# Patient Record
Sex: Female | Born: 1935 | Race: White | Hispanic: No | Smoking: Never smoker
Health system: Southern US, Community
[De-identification: ages and names within clinical notes are randomized; demographics above are authoritative.]

## PROBLEM LIST (undated history)

## (undated) DIAGNOSIS — Z950 Presence of cardiac pacemaker: Secondary | ICD-10-CM

## (undated) DIAGNOSIS — I2510 Atherosclerotic heart disease of native coronary artery without angina pectoris: Secondary | ICD-10-CM

## (undated) DIAGNOSIS — F0280 Dementia in other diseases classified elsewhere without behavioral disturbance: Secondary | ICD-10-CM

## (undated) DIAGNOSIS — I509 Heart failure, unspecified: Secondary | ICD-10-CM

## (undated) DIAGNOSIS — K37 Unspecified appendicitis: Secondary | ICD-10-CM

## (undated) DIAGNOSIS — I255 Ischemic cardiomyopathy: Secondary | ICD-10-CM

## (undated) DIAGNOSIS — G309 Alzheimer's disease, unspecified: Secondary | ICD-10-CM

## (undated) HISTORY — PX: CORONARY ARTERY BYPASS GRAFT: SHX141

## (undated) HISTORY — PX: CARDIAC CATHETERIZATION: SHX172

## (undated) HISTORY — PX: APPENDECTOMY: SHX54

## (undated) HISTORY — PX: ABDOMINAL HYSTERECTOMY: SHX81

---

## 2003-05-11 ENCOUNTER — Other Ambulatory Visit: Payer: Self-pay

## 2003-05-12 ENCOUNTER — Other Ambulatory Visit: Payer: Self-pay

## 2003-05-28 ENCOUNTER — Other Ambulatory Visit: Payer: Self-pay

## 2003-05-29 ENCOUNTER — Other Ambulatory Visit: Payer: Self-pay

## 2003-10-10 ENCOUNTER — Other Ambulatory Visit: Payer: Self-pay

## 2004-04-19 ENCOUNTER — Other Ambulatory Visit: Payer: Self-pay

## 2004-04-19 ENCOUNTER — Emergency Department: Payer: Self-pay | Admitting: Emergency Medicine

## 2004-07-26 ENCOUNTER — Inpatient Hospital Stay: Payer: Self-pay | Admitting: Internal Medicine

## 2004-07-26 ENCOUNTER — Other Ambulatory Visit: Payer: Self-pay

## 2004-12-14 ENCOUNTER — Other Ambulatory Visit: Payer: Self-pay

## 2004-12-14 ENCOUNTER — Inpatient Hospital Stay: Payer: Self-pay | Admitting: Internal Medicine

## 2005-06-20 ENCOUNTER — Other Ambulatory Visit: Payer: Self-pay

## 2005-06-20 ENCOUNTER — Inpatient Hospital Stay: Payer: Self-pay | Admitting: Internal Medicine

## 2005-06-21 ENCOUNTER — Other Ambulatory Visit: Payer: Self-pay

## 2005-08-14 ENCOUNTER — Inpatient Hospital Stay: Payer: Self-pay | Admitting: Internal Medicine

## 2005-08-14 ENCOUNTER — Other Ambulatory Visit: Payer: Self-pay

## 2006-01-07 ENCOUNTER — Inpatient Hospital Stay: Payer: Self-pay | Admitting: Internal Medicine

## 2006-03-25 ENCOUNTER — Ambulatory Visit: Payer: Self-pay

## 2006-04-28 ENCOUNTER — Other Ambulatory Visit: Payer: Self-pay

## 2006-04-28 ENCOUNTER — Emergency Department: Payer: Self-pay | Admitting: Emergency Medicine

## 2006-09-06 ENCOUNTER — Other Ambulatory Visit: Payer: Self-pay

## 2006-09-06 ENCOUNTER — Inpatient Hospital Stay: Payer: Self-pay | Admitting: Internal Medicine

## 2007-05-06 ENCOUNTER — Other Ambulatory Visit: Payer: Self-pay

## 2007-05-06 ENCOUNTER — Inpatient Hospital Stay: Payer: Self-pay | Admitting: Internal Medicine

## 2007-05-27 ENCOUNTER — Inpatient Hospital Stay: Payer: Self-pay | Admitting: Internal Medicine

## 2007-05-27 ENCOUNTER — Other Ambulatory Visit: Payer: Self-pay

## 2007-05-28 ENCOUNTER — Other Ambulatory Visit: Payer: Self-pay

## 2007-06-02 ENCOUNTER — Other Ambulatory Visit: Payer: Self-pay

## 2007-06-02 ENCOUNTER — Inpatient Hospital Stay: Payer: Self-pay | Admitting: Internal Medicine

## 2007-10-09 ENCOUNTER — Other Ambulatory Visit: Payer: Self-pay

## 2007-10-09 ENCOUNTER — Emergency Department: Payer: Self-pay | Admitting: Emergency Medicine

## 2007-12-19 ENCOUNTER — Other Ambulatory Visit: Payer: Self-pay

## 2007-12-19 ENCOUNTER — Emergency Department: Payer: Self-pay | Admitting: Emergency Medicine

## 2007-12-21 ENCOUNTER — Other Ambulatory Visit: Payer: Self-pay

## 2007-12-21 ENCOUNTER — Inpatient Hospital Stay: Payer: Self-pay | Admitting: Rheumatology

## 2008-05-05 ENCOUNTER — Emergency Department: Payer: Self-pay | Admitting: Emergency Medicine

## 2008-09-08 ENCOUNTER — Ambulatory Visit: Payer: Self-pay

## 2009-04-26 IMAGING — CT CT THORACIC SPINE WITHOUT CONTRAST
2 of 3 series · 9 of 14 positions shown, 11 images · non-contrast
Comparison: none

REASON FOR EXAM: back pain, bilateral leg weakness
COMMENTS:

[Series 2: thoracic axial soft tissue range 1 · axial · 0.56mm/px · z∈[-297,-259]mm · 3 of 85 slices shown]
[im 22/85  soft-tissue]
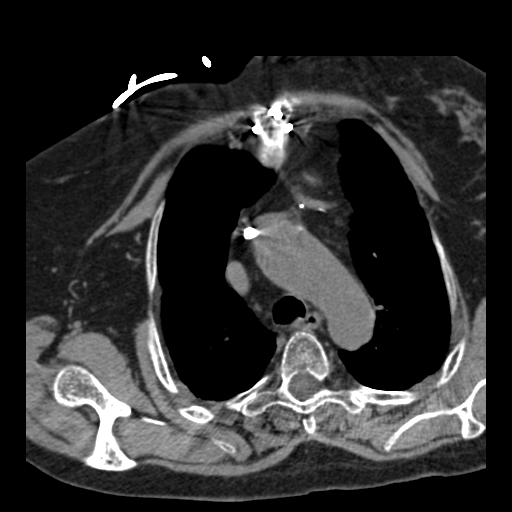
[im 43/85  soft-tissue]
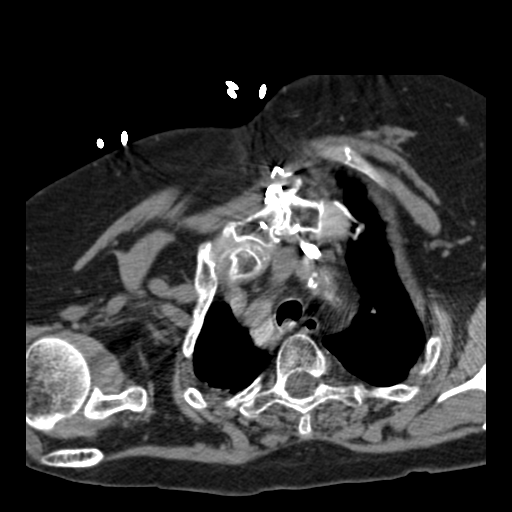
[im 64/85  soft-tissue]
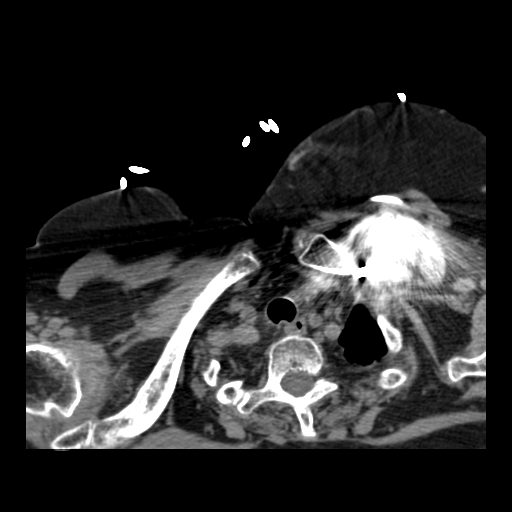

[Series 4: thoracic soft tissue range 3 · axial · 0.56mm/px · z∈[-364,-251]mm · 6 of 161 slices shown, 8 images]
[im 23/161  soft-tissue]
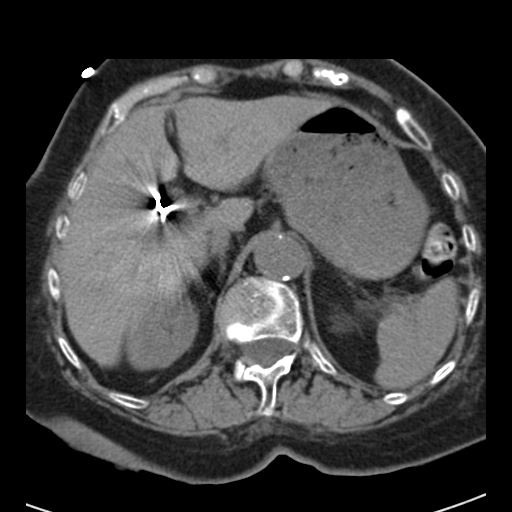
[im 23/161  bone]
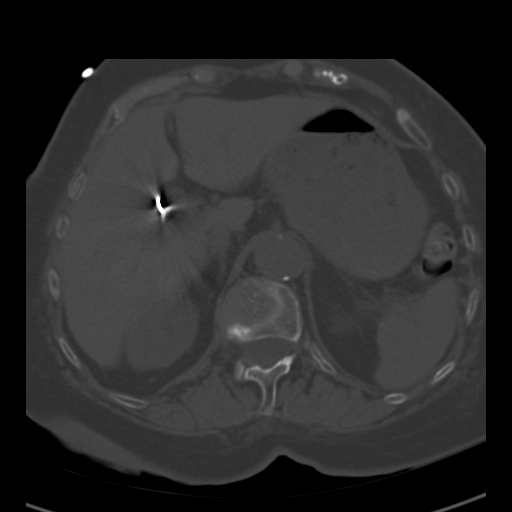
[im 46/161  bone]
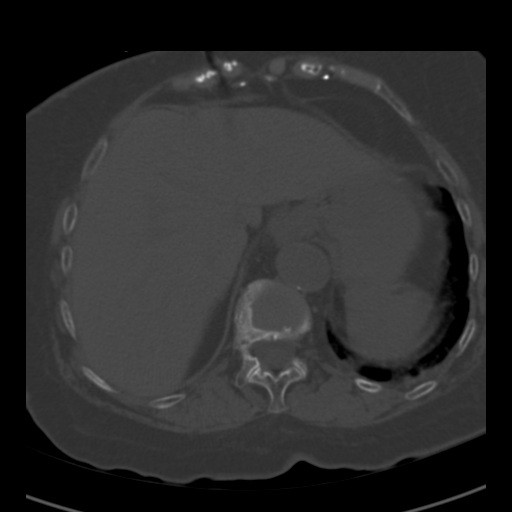
[im 69/161  bone]
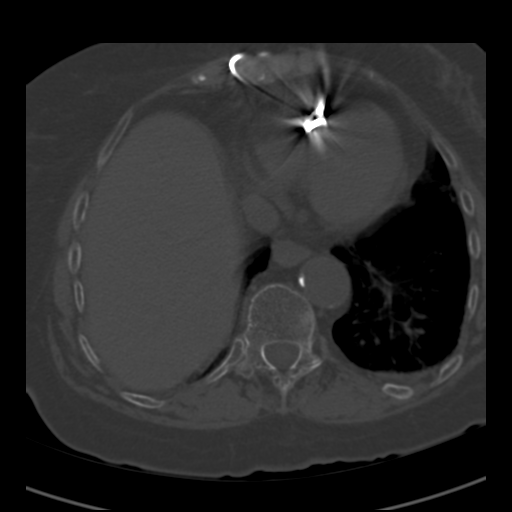
[im 92/161  bone]
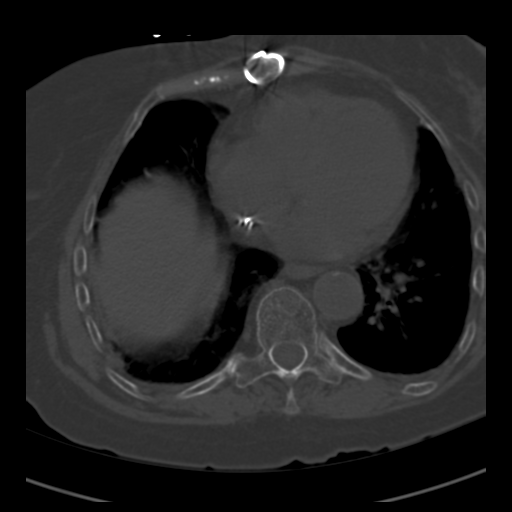
[im 115/161  soft-tissue]
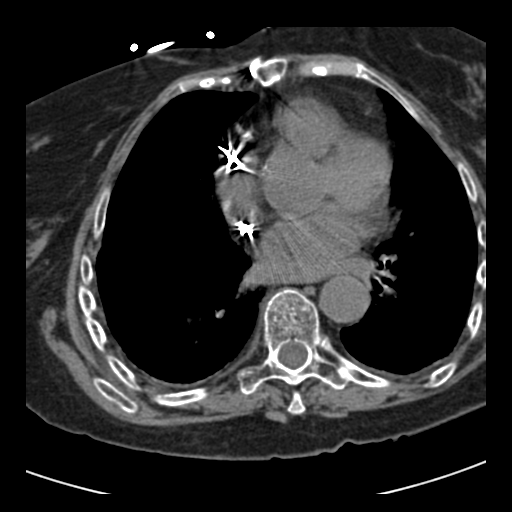
[im 115/161  bone]
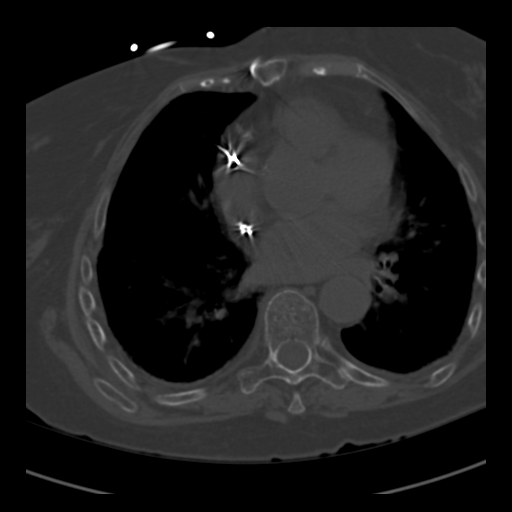
[im 138/161  bone]
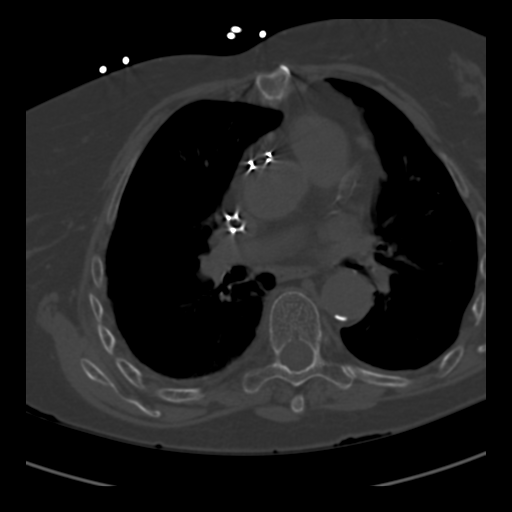

[9 of 14 positions shown; findings below may reference images not displayed]

PROCEDURE:     CT  - CT THORACIC SPINE WO  - December 23, 2007  [DATE]

RESULT:     Non-contrast multislice helical CT through the thoracic spine
with soft tissue axial, sagittal and coronal reconstructions is performed.
There is no previous similar exam for comparison.

The study demonstrates multilevel degenerative bony changes. There is loss
of height in the T6 and T7 vertebral bodies which appears to be
approximately 30% to 50%. There is no subluxation. Multilevel facet
hypertrophy is present. A discrete focal disc herniation is not evident. No
bony destruction is seen.
IMPRESSION: 1.Bony degenerative changes. No evidence of severe spinal stenosis or
definite severe foraminal stenosis. If further investigation is desired,
myelographic imaging could be considered.

## 2010-07-07 ENCOUNTER — Inpatient Hospital Stay: Payer: Self-pay | Admitting: Internal Medicine

## 2010-08-12 ENCOUNTER — Ambulatory Visit: Payer: Self-pay

## 2010-09-03 ENCOUNTER — Emergency Department: Payer: Self-pay | Admitting: Emergency Medicine

## 2012-04-17 ENCOUNTER — Inpatient Hospital Stay (HOSPITAL_COMMUNITY)
Admission: EM | Admit: 2012-04-17 | Discharge: 2012-04-18 | DRG: 287 | Disposition: A | Discharge status: Home / Self Care | Payer: Medicare Other | Attending: Cardiovascular Disease | Admitting: Cardiovascular Disease

## 2012-04-17 ENCOUNTER — Encounter (HOSPITAL_COMMUNITY): Payer: Self-pay

## 2012-04-17 ENCOUNTER — Ambulatory Visit (HOSPITAL_COMMUNITY): Admit: 2012-04-17 | Payer: Self-pay | Admitting: Cardiovascular Disease

## 2012-04-17 ENCOUNTER — Encounter (HOSPITAL_COMMUNITY)
Admission: EM | Disposition: A | Discharge status: Home / Self Care | Payer: Self-pay | Source: Home / Self Care | Attending: Cardiovascular Disease

## 2012-04-17 ENCOUNTER — Inpatient Hospital Stay (HOSPITAL_COMMUNITY): Payer: Medicare Other

## 2012-04-17 ENCOUNTER — Other Ambulatory Visit: Payer: Self-pay

## 2012-04-17 DIAGNOSIS — Z951 Presence of aortocoronary bypass graft: Secondary | ICD-10-CM

## 2012-04-17 DIAGNOSIS — R21 Rash and other nonspecific skin eruption: Secondary | ICD-10-CM | POA: Diagnosis present

## 2012-04-17 DIAGNOSIS — I249 Acute ischemic heart disease, unspecified: Secondary | ICD-10-CM

## 2012-04-17 DIAGNOSIS — I2510 Atherosclerotic heart disease of native coronary artery without angina pectoris: Secondary | ICD-10-CM

## 2012-04-17 DIAGNOSIS — I255 Ischemic cardiomyopathy: Secondary | ICD-10-CM | POA: Diagnosis present

## 2012-04-17 DIAGNOSIS — I447 Left bundle-branch block, unspecified: Secondary | ICD-10-CM | POA: Diagnosis present

## 2012-04-17 DIAGNOSIS — Z950 Presence of cardiac pacemaker: Secondary | ICD-10-CM

## 2012-04-17 DIAGNOSIS — R51 Headache: Secondary | ICD-10-CM | POA: Diagnosis present

## 2012-04-17 DIAGNOSIS — R079 Chest pain, unspecified: Secondary | ICD-10-CM | POA: Diagnosis present

## 2012-04-17 DIAGNOSIS — G250 Essential tremor: Secondary | ICD-10-CM

## 2012-04-17 DIAGNOSIS — G252 Other specified forms of tremor: Secondary | ICD-10-CM | POA: Diagnosis present

## 2012-04-17 DIAGNOSIS — I200 Unstable angina: Secondary | ICD-10-CM

## 2012-04-17 DIAGNOSIS — E785 Hyperlipidemia, unspecified: Secondary | ICD-10-CM

## 2012-04-17 DIAGNOSIS — E876 Hypokalemia: Secondary | ICD-10-CM | POA: Diagnosis present

## 2012-04-17 DIAGNOSIS — I359 Nonrheumatic aortic valve disorder, unspecified: Secondary | ICD-10-CM

## 2012-04-17 DIAGNOSIS — I10 Essential (primary) hypertension: Secondary | ICD-10-CM

## 2012-04-17 DIAGNOSIS — I25810 Atherosclerosis of coronary artery bypass graft(s) without angina pectoris: Secondary | ICD-10-CM

## 2012-04-17 DIAGNOSIS — I252 Old myocardial infarction: Secondary | ICD-10-CM

## 2012-04-17 HISTORY — DX: Unspecified appendicitis: K37

## 2012-04-17 HISTORY — DX: Ischemic cardiomyopathy: I25.5

## 2012-04-17 HISTORY — PX: PERCUTANEOUS CORONARY STENT INTERVENTION (PCI-S): SHX5485

## 2012-04-17 HISTORY — PX: LEFT HEART CATHETERIZATION WITH CORONARY ANGIOGRAM: SHX5451

## 2012-04-17 HISTORY — DX: Atherosclerotic heart disease of native coronary artery without angina pectoris: I25.10

## 2012-04-17 HISTORY — DX: Presence of cardiac pacemaker: Z95.0

## 2012-04-17 LAB — BASIC METABOLIC PANEL
Calcium: 8.8 mg/dL (ref 8.4–10.5)
GFR calc non Af Amer: 80 mL/min — ABNORMAL LOW (ref >90)
Glucose, Bld: 108 mg/dL — ABNORMAL HIGH (ref 70–99)
Sodium: 139 mEq/L (ref 135–145)

## 2012-04-17 LAB — POCT I-STAT, CHEM 8
Calcium, Ion: 1.08 mmol/L — ABNORMAL LOW (ref 1.13–1.30)
Glucose, Bld: 108 mg/dL — ABNORMAL HIGH (ref 70–99)
HCT: 39 % (ref 36.0–46.0)
Hemoglobin: 13.3 g/dL (ref 12.0–15.0)
Potassium: 3.2 mEq/L — ABNORMAL LOW (ref 3.5–5.1)

## 2012-04-17 LAB — CBC
MCH: 28.6 pg (ref 26.0–34.0)
Platelets: 158 10*3/uL (ref 150–400)
RBC: 4.48 MIL/uL (ref 3.87–5.11)
WBC: 6.4 10*3/uL (ref 4.0–10.5)

## 2012-04-17 LAB — DIFFERENTIAL
Basophils Absolute: 0.1 10*3/uL (ref 0.0–0.1)
Basophils Relative: 1 % (ref 0–1)
Eosinophils Absolute: 0.1 10*3/uL (ref 0.0–0.7)
Eosinophils Relative: 2 % (ref 0–5)
Lymphocytes Relative: 25 % (ref 12–46)

## 2012-04-17 LAB — PROTIME-INR: Prothrombin Time: 13.2 seconds (ref 11.6–15.2)

## 2012-04-17 SURGERY — LEFT HEART CATHETERIZATION WITH CORONARY ANGIOGRAM
Anesthesia: LOCAL

## 2012-04-17 MED ORDER — SODIUM CHLORIDE 0.9 % IJ SOLN
3.0000 mL | Freq: Two times a day (BID) | INTRAMUSCULAR | Status: DC
  Administered 2012-04-17 (×2): 3 mL via INTRAVENOUS

## 2012-04-17 MED ORDER — NITROGLYCERIN IN D5W 200-5 MCG/ML-% IV SOLN
INTRAVENOUS | Status: AC
  Administered 2012-04-17: 50000 ug
  Filled 2012-04-17: qty 250

## 2012-04-17 MED ORDER — FENTANYL CITRATE 0.05 MG/ML IJ SOLN
INTRAMUSCULAR | Status: AC
  Filled 2012-04-17: qty 2

## 2012-04-17 MED ORDER — ONDANSETRON HCL 4 MG/2ML IJ SOLN: 4.0000 mg | Freq: Four times a day (QID) | INTRAMUSCULAR | Status: DC | PRN

## 2012-04-17 MED ORDER — NITROGLYCERIN 0.4 MG SL SUBL
0.4000 mg | SUBLINGUAL_TABLET | SUBLINGUAL | Status: DC | PRN
  Filled 2012-04-17 (×2): qty 25

## 2012-04-17 MED ORDER — MORPHINE SULFATE 2 MG/ML IJ SOLN
INTRAMUSCULAR | Status: AC
  Filled 2012-04-17: qty 1

## 2012-04-17 MED ORDER — PANTOPRAZOLE SODIUM 40 MG PO TBEC
40.0000 mg | DELAYED_RELEASE_TABLET | Freq: Every day | ORAL | Status: DC
  Administered 2012-04-17 – 2012-04-18 (×2): 40 mg via ORAL
  Filled 2012-04-17 (×2): qty 1

## 2012-04-17 MED ORDER — HEPARIN (PORCINE) IN NACL 2-0.9 UNIT/ML-% IJ SOLN
INTRAMUSCULAR | Status: AC
  Filled 2012-04-17: qty 1000

## 2012-04-17 MED ORDER — ACETAMINOPHEN 325 MG PO TABS
650.0000 mg | ORAL_TABLET | ORAL | Status: DC | PRN
  Administered 2012-04-17 (×3): 650 mg via ORAL
  Filled 2012-04-17 (×4): qty 2

## 2012-04-17 MED ORDER — NITROGLYCERIN 0.2 MG/ML ON CALL CATH LAB
INTRAVENOUS | Status: AC
  Filled 2012-04-17: qty 1

## 2012-04-17 MED ORDER — NITROGLYCERIN IN D5W 200-5 MCG/ML-% IV SOLN
2.0000 ug/min | INTRAVENOUS | Status: DC
  Administered 2012-04-17: 5 ug/min via INTRAVENOUS

## 2012-04-17 MED ORDER — MIDAZOLAM HCL 2 MG/2ML IJ SOLN
INTRAMUSCULAR | Status: AC
  Filled 2012-04-17: qty 2

## 2012-04-17 MED ORDER — HEPARIN SODIUM (PORCINE) 5000 UNIT/ML IJ SOLN
60.0000 [IU]/kg | Freq: Once | INTRAMUSCULAR | Status: AC
  Administered 2012-04-17: 4000 [IU] via INTRAVENOUS

## 2012-04-17 MED ORDER — LIDOCAINE HCL (PF) 1 % IJ SOLN
INTRAMUSCULAR | Status: AC
  Filled 2012-04-17: qty 30

## 2012-04-17 MED ORDER — ENOXAPARIN SODIUM 40 MG/0.4ML ~~LOC~~ SOLN
40.0000 mg | SUBCUTANEOUS | Status: DC
  Filled 2012-04-17: qty 0.4

## 2012-04-17 MED ORDER — ASPIRIN EC 81 MG PO TBEC
81.0000 mg | DELAYED_RELEASE_TABLET | Freq: Every day | ORAL | Status: DC
  Administered 2012-04-18: 81 mg via ORAL
  Filled 2012-04-17: qty 1

## 2012-04-17 MED ORDER — SODIUM CHLORIDE 0.9 % IV SOLN
1.0000 mL/kg/h | INTRAVENOUS | Status: AC
  Administered 2012-04-17: 1 mL/kg/h via INTRAVENOUS

## 2012-04-17 MED ORDER — ACETAMINOPHEN 325 MG PO TABS
650.0000 mg | ORAL_TABLET | ORAL | Status: DC | PRN
  Filled 2012-04-17: qty 2

## 2012-04-17 MED ORDER — SODIUM CHLORIDE 0.9 % IJ SOLN: 3.0000 mL | INTRAMUSCULAR | Status: DC | PRN

## 2012-04-17 MED ORDER — INFLUENZA VIRUS VACC SPLIT PF IM SUSP
0.5000 mL | INTRAMUSCULAR | Status: AC
  Administered 2012-04-18: 0.5 mL via INTRAMUSCULAR
  Filled 2012-04-17: qty 0.5

## 2012-04-17 MED ORDER — MIDAZOLAM HCL 2 MG/2ML IJ SOLN
INTRAMUSCULAR | Status: AC
  Administered 2012-04-17: 02:00:00
  Filled 2012-04-17: qty 2

## 2012-04-17 MED ORDER — SODIUM CHLORIDE 0.9 % IV SOLN: 250.0000 mL | INTRAVENOUS | Status: DC

## 2012-04-17 MED ORDER — METOPROLOL TARTRATE 25 MG PO TABS
25.0000 mg | ORAL_TABLET | Freq: Two times a day (BID) | ORAL | Status: DC
  Administered 2012-04-17 – 2012-04-18 (×2): 25 mg via ORAL
  Filled 2012-04-17 (×3): qty 1

## 2012-04-17 MED ORDER — PRIMIDONE 50 MG PO TABS
50.0000 mg | ORAL_TABLET | Freq: Two times a day (BID) | ORAL | Status: DC
  Administered 2012-04-17 – 2012-04-18 (×2): 50 mg via ORAL
  Filled 2012-04-17 (×3): qty 1

## 2012-04-17 MED ORDER — POTASSIUM CHLORIDE CRYS ER 20 MEQ PO TBCR
40.0000 meq | EXTENDED_RELEASE_TABLET | Freq: Once | ORAL | Status: AC
  Administered 2012-04-17: 40 meq via ORAL
  Filled 2012-04-17: qty 2

## 2012-04-17 MED ORDER — ONDANSETRON HCL 4 MG/2ML IJ SOLN
INTRAMUSCULAR | Status: AC
  Administered 2012-04-17: 4 mg
  Filled 2012-04-17: qty 2

## 2012-04-17 MED ORDER — CLOPIDOGREL BISULFATE 75 MG PO TABS
75.0000 mg | ORAL_TABLET | Freq: Every day | ORAL | Status: DC
  Administered 2012-04-17 – 2012-04-18 (×2): 75 mg via ORAL
  Filled 2012-04-17 (×2): qty 1

## 2012-04-17 MED ORDER — LABETALOL HCL 5 MG/ML IV SOLN
INTRAVENOUS | Status: AC
  Filled 2012-04-17: qty 4

## 2012-04-17 NOTE — ED Notes (Signed)
Per EMS, given 324 ASA, 1 inch paste, 3 SL nitro.

## 2012-04-17 NOTE — Progress Notes (Signed)
Called report to [place] on 3W. Notified patients family of move. Will continue to monitor.

## 2012-04-17 NOTE — Progress Notes (Signed)
Technician here to perform Echo at bedside. Transfer placed on hold for this procedure to be done.

## 2012-04-17 NOTE — H&P (Signed)
Patient ID: Joanna Park MRN: [id number], DOB/AGE: 76-Sep-1930   Admit date: 04/17/2012   Primary Physician: Bethann Punches Primary Cardiologist: Dr. Gwen Pounds [facility])  Pt. Profile:  Problem List  Past Medical History  Diagnosis Date  . MI (myocardial infarction)   . Hx of CABG   . Pacemaker   . Appendicitis     Past Surgical History  Procedure Date  . Coronary artery bypass graft      Allergies  Allergies  Allergen Reactions  . Morphine And Related     HPI  78 y/o female with h/o CABG (anatomy unknown) who states that yesterday evening ~11:30 pm she developed the acute onset of substernal chest pressure that felt like a "boulder sitting on her chest."  It radiated to the neck and was associated with dyspnea.  She states it felt like her prior MI.  EMS was called and did an ECG which showed a LBBB.  A STEMI alert was called and she was transferred to the [facility] cath lab as we had no old ECGs to confirm if the LBBB was new or old.  She was given ASA 325, nitropaste, and heparin 4000 unit bolus en route.  Cath showed patent SVG to Diag, patent SVG to distal RCA, patent LIMA to LAD; native 100% RCA occlusion; proximal moderate LCx disease with large patent OM; and a ramus vessel that was proximally occluded and filled via collaterals (possible culprit vessel); no PCI was performed as most major territories were adequately vascularized; LV gram showed apical hypokinesis  Speaking the with daughter and son-in-law afterwards, the patient is actually quite robust for her age.  She had been in her normal state of health until this acute episode.  Post-cath the patient continues to have chest discomfort but appears much more comfortable.  Home Medications  Prior to Admission medications   Not on File  Daughter is to bring in list tomorrow; it sounds like she takes aspirin, a PPI, and a blood pressure pill  Family History  Reviewed, noncontributory  Social  History  History   Social History  . Marital Status: Married    Spouse Name: N/A    Number of Children: N/A  . Years of Education: N/A   Occupational History  . Not on file.   Social History Main Topics  . Smoking status: Never Smoker   . Smokeless tobacco: Not on file  . Alcohol Use:   . Drug Use:   . Sexually Active:    Other Topics Concern  . Not on file   Social History Narrative  . No narrative on file     Review of Systems She complains of a mild HA that started after the nitropaste was applied. All other systems reviewed and are otherwise negative except as noted above.  Physical Exam  Blood pressure 162/85, pulse 60, temperature 98.1 F (36.7 C), temperature source Oral, resp. rate 19, height 5\' 4"  (1.626 m), weight 135 lb (61.236 kg), SpO2 100.00%.  General: moderate distress initially, now calm Psych: Normal affect. Neuro: Alert and oriented X 3. Moves all extremities spontaneously. HEENT: Normal  Neck: Supple without JVD Lungs:  Resp regular and unlabored, CTA. Heart: RRR no s3, s4, or murmurs. Abdomen: Soft, non-tender, non-distended Extremities: Warm, no edema. Femoral/radials 2+ and equal bilaterally.  Labs   Basename 04/17/12 0100  CKTOTAL --  CKMB --  TROPONINI <0.30   Lab Results  Component Value Date   WBC 6.4 04/17/2012   HGB 13.3 04/17/2012  HCT 39.0 04/17/2012   MCV 88.2 04/17/2012   PLT 158 04/17/2012    Lab 04/17/12 0108  NA 139  K 3.2*  CL 101  CO2 --  BUN 7  CREATININE 0.80  CALCIUM --  PROT --  BILITOT --  ALKPHOS --  ALT --  AST --  GLUCOSE 108*     Radiology/Studies  No results found.  ECG: A-paced in the 60s, LBBB with appropriate ST segment discordance (no old ECGs available)  ASSESSMENT AND PLAN 1. Chest pain- possibly MI from small ramus occlusion as noted above; also possibly non-cardiac (MSK, pleurisy, GERD); doubt PE given no tachycardia/hypoxia or real risk factors for PE, and dyspnea is not a main  complaint; aortic dissection was considered and the patient underwent an aortogram in the cath lab that did not reveal any dissection  -s/p cardiac cath with Mynx closure device; monitor for bleeding  -Nitro gtt for now, cycle troponins (if they bump, will start UFH gtt and treat medically for NSTEMI)  -check CXR, TTE 2. CAD s/p CABG  -daughter said she will bring in home med list in the a.m. 3. Hypokalemia  -will give 40 mEq KCl now 4. Prophylaxis  -SCDs for now given recent arterial puncture 5. Headache- from NTG; APAP prn  Full Code.    Ozella Almond, MD 04/17/2012, 1:45 AM  5

## 2012-04-17 NOTE — ED Notes (Signed)
Pt. Is poor historian.

## 2012-04-17 NOTE — CV Procedure (Addendum)
   Cardiac Catheterization Procedure Note  Name: Joanna Park MRN: [id number] DOB: August 04, 1928  Primary Cardiologist: Dr Gwen Pounds Primary Care Physician: Dr Hyacinth Meeker  Procedure: Left Heart Cath, Selective Coronary Angiography, LV angiography, saphenous vein graft angiography, LIMA angiography, aortic root angiography, closure of the right femoral artery.  Indication: 78 year old woman who presented with chest pain. She has known coronary disease with prior CABG. She is a poor historian we don't have any of the details of her bypass surgery. She states that her chest pain is just like that of her prior heart attack. She is uncomfortable in describing 6/10 chest pain. Her EKG shows an atrial paced rhythm with left bundle branch block. There is no old EKG for comparison. Considering her left bundle branch block, ongoing chest pain, and known coronary artery disease, we elected to proceed emergently with cardiac catheterization and possible PCI.  Procedural details: The right groin was prepped, draped, and anesthetized with 1% lidocaine. Using modified Seldinger technique, a 5 French sheath was introduced into the right femoral artery. Standard Judkins catheters were used for coronary angiography and left ventriculography. Catheter exchanges were performed over a guidewire.  JR 4 catheter was used for vein graft angiography. A 3 DRC catheter was used to image left subclavian artery and the LIMA nonselectively. A pigtail catheter was used to perform ventriculography and aortic root angiography. There were no immediate procedural complications. The patient was transferred to the post catheterization recovery area for further monitoring.  Procedural Findings: Hemodynamics:  AO  179/81 LV  176/9   Coronary angiography: Coronary dominance: right  Left mainstem: The left mainstem is patent. There is diffuse distal left mainstem stenosis of 70% with heavy calcification.   Left anterior descending (LAD):   the LAD is totally occluded at the ostium.  Left circumflex (LCx):  the left circumflex has a single large obtuse marginal branch with no significant stenosis. The origin of the left circumflex at the interface of the left main has diffuse calcified 50-70% stenosis. There is a small first OM branch with subtotal occlusion.  Right coronary artery (RCA): totally occlusion in the proximal segment.  Saphenous vein graft to diagonal branch: Widely patent throughout. There is collateral filling of an intermediate branch.  Saphenous vein graft to distal RCA: Widely patent throughout. The PDA and PLA branches are patent. The distal RCA fills retrograde and is severely diseased.  LIMA to LAD: This was imaged nonselectively. The subclavian was tortuous. There is no significant subclavian stenosis. The left vertebral artery is very large. The LIMA is patent throughout its course and its anastomotic site to the LAD is patent. The LAD appears small in caliber throughout its course.   Left ventriculography:  there is diffuse LV hypokinesis. The basal inferior wall is severe hypokinesis. The periapical region has moderate hypokinesis. The estimated left ventricular ejection fraction is 40-45%. I do not visualize any significant mitral regurgitation.   Aortic root angiography: There is no significant aortic insufficiency. I do not appreciate any evidence of dissection or aneurysm.  Final Conclusions:   1. Severe native 3 vessel CAD 2. S/p CABG with patent LIMA-LAD, SVG-RCA, and SVG-diagonal 3. Moderate LV systolic dysfunction  Recommendations: will cycle cardiac enzymes and check an echocardiogram. It is possible that the small obtuse marginal branches this patient's culprit vessel. However, this could also be a chronic lesion. I think that medical therapy is appropriate.  Tonny Bollman 04/17/2012, 2:25 AM

## 2012-04-17 NOTE — ED Notes (Signed)
Pt. Reports sudden onset chest pressure at 2340. Given 324 ASA, 1 inch paste, 3 SL nitro by EMS. States "Feels like my last MI".

## 2012-04-17 NOTE — ED Notes (Signed)
Pt. 135lbs and 5\' 4"  reported

## 2012-04-17 NOTE — Progress Notes (Signed)
  Echocardiogram 2D Echocardiogram has been performed.  Joanna Park FRANCES 04/17/2012, 4:48 PM

## 2012-04-17 NOTE — Progress Notes (Signed)
Brief Progress Note:  S: The patient is a very pleasant 78 YO woman. She did well with cath and has tolerated it well. No return of chest pain and no pain at this time. Still on nitro drip and fluids post cath. She is asking about when she can go home. No SOB, weakness, leg pain, nausea. Does complain of headache likely from NTG drip.  O: PE Gen: pleasant CV: lungs clear, heart sounds paced.  Abd: non tender non-distended, soft +BS Extrem: no edema or tenderness Skin: no breakdown  Update to A/P:  1. Chest pain r/o ACS - Troponin negative times 2 and will continue to follow,. S/P cath and intervention needed, some diffuse disease and will need medical management. Echo ordered for today and lipid panel. Can likely stop nitro drip today. Tylenol for nitro headache.  2. CAD - Still need home med list, on ASA daily. Continue to follow EKG.  Dispo -   Will transfer to tele bed.  Cath was unchanged. Will DC NTG drip.  Home tomorrow if she remains stable.  Dx 1. CAD , s/p CABG. 2. Chest pain - does not appear to be related to CAD' 3. LBBB 4, pacer.  Vesta Mixer, Montez Hageman., MD, [facility] 04/17/2012, 12:38 PM Office - [phone] Pager [phone]

## 2012-04-17 NOTE — Progress Notes (Signed)
CARDIAC REHAB PHASE I   PRE:  Rate/Rhythm: 67 Pacing  BP:  Supine: 137/63  Sitting: 135/73  Standing:    SaO2: 100 2L 100 RA  MODE:  Ambulation: 20 ft   POST:  Rate/Rhythem: 98  BP:  Supine:   Sitting: 147/74  Standing:    SaO2: 99 RA 1335-1420 RN states that pt got up to [facility] earlier and was unsteady and c/o of dizziness. On arrival pt in bed explained that she needs to try to get up and try to walk in hall. Pt's daughter in room states that that she uses walker at home and all she walks is in the house. Daughter does grocery shopping. Pt states that she fell on Thursday prior to coming to hospital. She c/o that her legs have felt very weak and tremble a lot when trying to walk. Assisted X 1 and used walker to ambulate. Pt weak and unsteady. Her legs tremble with walking. Pt only able to get to bathroom and was exhausted. Placed pt in recliner. VS stable. RA sat after walk 99%. O2 left off.Would recommend a Physical Therapy consult to assess discharge needs. No c/o of cp with walking to bathroom.  Beatrix Fetters

## 2012-04-17 NOTE — Progress Notes (Signed)
Chaplain Note: Chaplain responded immediately to code STEMI page received at 01:23.  Pt was in Cath Lab being treated by Cath Lab staff.  When pt's family arrived, chaplain provided spiritual comfort and support.  Following the pt's cath procedure, chaplain supported family while pt's physician explained the pt's condition.  Chaplain escorted family to CICU waiting area and informed CICU staff of their presence.  Family expressed appreciation for chaplain support.  Chaplain will follow up as needed.  04/17/12 0200  Clinical Encounter Type  Visited With Patient and family together  Visit Type Spiritual support  Referral From Other (Comment) (Code STEMI page)  Spiritual Encounters  Spiritual Needs Emotional  Stress Factors  Patient Stress Factors Major life changes;Health changes  Family Stress Factors Lack of knowledge   Verdie Shire, [place] [phone]

## 2012-04-17 NOTE — ED Provider Notes (Signed)
History     CSN: [id number]  Arrival date & time 04/17/12  0056   First MD Initiated Contact with Patient 04/17/12 0106      Chief Complaint  Patient presents with  . Code STEMI    (Consider location/radiation/quality/duration/timing/severity/associated sxs/prior treatment) HPI Comments: 78 year old female with a history of coronary disease status post CABG, status post pacemaker and myocardial infarction in the past who presents with a complaint of chest pain which started acute in onset in 1 hour ago, midsternal, heaviness, persistent, slightly improved with nitroglycerin paste. She was given aspirin by paramedics and route. Her EKG showed a left bundle branch block, a paced rhythm, there is no old EKG with which to compare and thus a code STEMI was called prior to arrival. The patient denies anynausea vomiting swelling of the legs or diaphoresis, but she does admit to mild shortness of breath.  The history is provided by the patient and the EMS personnel.    Past Medical History  Diagnosis Date  . Hx of CABG   . Pacemaker   . Appendicitis   . MI (myocardial infarction) 2011    Past Surgical History  Procedure Date  . Coronary artery bypass graft   . Appendectomy   . Abdominal hysterectomy   . Cardiac catheterization     No family history on file.  History  Substance Use Topics  . Smoking status: Never Smoker   . Smokeless tobacco: Not on file  . Alcohol Use: No    OB History    Grav Para Term Preterm Abortions TAB SAB Ect Mult Living                  Review of Systems  All other systems reviewed and are negative.    Allergies  Morphine and related  Home Medications  No current outpatient prescriptions on file.  BP 136/73  Pulse 59  Temp 97.3 F (36.3 C) (Oral)  Resp 16  Ht 5\' 4"  (1.626 m)  Wt 118 lb 9.7 oz (53.8 kg)  BMI 20.36 kg/m2  SpO2 100%  Physical Exam  Nursing note and vitals reviewed. Constitutional: She appears well-developed and  well-nourished. No distress.  HENT:  Head: Normocephalic and atraumatic.  Mouth/Throat: Oropharynx is clear and moist. No oropharyngeal exudate.  Eyes: Conjunctivae normal and EOM are normal. Pupils are equal, round, and reactive to light. Right eye exhibits no discharge. Left eye exhibits no discharge. No scleral icterus.  Neck: Normal range of motion. Neck supple. No JVD present. No thyromegaly present.  Cardiovascular: Normal rate, regular rhythm, normal heart sounds and intact distal pulses.  Exam reveals no gallop and no friction rub.   No murmur heard. Pulmonary/Chest: Effort normal and breath sounds normal. No respiratory distress. She has no wheezes. She has no rales.  Abdominal: Soft. Bowel sounds are normal. She exhibits no distension and no mass. There is no tenderness.  Musculoskeletal: Normal range of motion. She exhibits no edema and no tenderness.  Lymphadenopathy:    She has no cervical adenopathy.  Neurological: She is alert. Coordination normal.  Skin: Skin is warm and dry. No rash noted. No erythema.  Psychiatric: She has a normal mood and affect. Her behavior is normal.    ED Course  Procedures (including critical care time)  Labs Reviewed  BASIC METABOLIC PANEL - Abnormal; Notable for the following:    Potassium 3.0 (*)     Glucose, Bld 108 (*)     GFR calc non Af Denyse Dago  80 (*)     All other components within normal limits  POCT I-STAT, CHEM 8 - Abnormal; Notable for the following:    Potassium 3.2 (*)     Glucose, Bld 108 (*)     Calcium, Ion 1.08 (*)     All other components within normal limits  CBC  PROTIME-INR  APTT  DIFFERENTIAL  TROPONIN I  MRSA PCR SCREENING  TROPONIN I  TROPONIN I   No results found.   1. Acute coronary syndrome       MDM  The patient is not appear to be in acute distress though she does have a left bundle branch block on her EKG which is possibly a new finding though there is no old EKGs with which to compare. Her  symptoms are concerning for acute coronary syndrome, the cardiologist is at the bedside, and he will discussed with the interventional is to decide whether catheterization is appropriate at this time. Labs pending, will start heparin and nitroglycerin drip. Blood pressure slightly elevated at 167/86, no tachycardia, no fever, no hypoxia.   ED ECG REPORT  I personally interpreted this EKG   Date: 04/17/2012   Rate: 67  Rhythm: Electronically paced  QRS Axis: left  Intervals: Widened QRS, prolonged PR  ST/T Wave abnormalities: nonspecific ST/T changes  Conduction Disutrbances:Left bundle branch block  Narrative Interpretation:   Old EKG Reviewed: none available   Dr. Excell Seltzer is at the bedside evaluating the patient for cardiology, he agrees to take the patient to the catheterization lab at this time, the patient has stable vital signs, has been given heparin and will go upstairs very quickly.  CRITICAL CARE Performed by: Vida Roller   Total critical care time: 35  Critical care time was exclusive of separately billable procedures and treating other patients.  Critical care was necessary to treat or prevent imminent or life-threatening deterioration.  Critical care was time spent personally by me on the following activities: development of treatment plan with patient and/or surrogate as well as nursing, discussions with consultants, evaluation of patient's response to treatment, examination of patient, obtaining history from patient or surrogate, ordering and performing treatments and interventions, ordering and review of laboratory studies, ordering and review of radiographic studies, pulse oximetry and re-evaluation of patient's condition.   Vida Roller, MD 04/17/12 [phone]

## 2012-04-18 ENCOUNTER — Encounter (HOSPITAL_COMMUNITY): Payer: Self-pay | Admitting: Nurse Practitioner

## 2012-04-18 DIAGNOSIS — I10 Essential (primary) hypertension: Secondary | ICD-10-CM

## 2012-04-18 DIAGNOSIS — I2510 Atherosclerotic heart disease of native coronary artery without angina pectoris: Secondary | ICD-10-CM | POA: Insufficient documentation

## 2012-04-18 DIAGNOSIS — I25810 Atherosclerosis of coronary artery bypass graft(s) without angina pectoris: Secondary | ICD-10-CM

## 2012-04-18 DIAGNOSIS — I255 Ischemic cardiomyopathy: Secondary | ICD-10-CM | POA: Diagnosis present

## 2012-04-18 DIAGNOSIS — I200 Unstable angina: Secondary | ICD-10-CM

## 2012-04-18 DIAGNOSIS — E785 Hyperlipidemia, unspecified: Secondary | ICD-10-CM

## 2012-04-18 DIAGNOSIS — G250 Essential tremor: Secondary | ICD-10-CM

## 2012-04-18 DIAGNOSIS — Z950 Presence of cardiac pacemaker: Secondary | ICD-10-CM

## 2012-04-18 LAB — CBC
HCT: 36.2 % (ref 36.0–46.0)
Hemoglobin: 11.5 g/dL — ABNORMAL LOW (ref 12.0–15.0)
MCH: 28.3 pg (ref 26.0–34.0)
MCHC: 31.8 g/dL (ref 30.0–36.0)
MCV: 89.2 fL (ref 78.0–100.0)
RDW: 14.1 % (ref 11.5–15.5)

## 2012-04-18 LAB — BASIC METABOLIC PANEL
BUN: 8 mg/dL (ref 6–23)
CO2: 26 mEq/L (ref 19–32)
Chloride: 106 mEq/L (ref 96–112)
Creatinine, Ser: 0.7 mg/dL (ref 0.50–1.10)
GFR calc Af Amer: >90 mL/min (ref >90)
Glucose, Bld: 96 mg/dL (ref 70–99)
Potassium: 4 mEq/L (ref 3.5–5.1)

## 2012-04-18 LAB — LIPID PANEL
LDL Cholesterol: 121 mg/dL — ABNORMAL HIGH (ref 0–99)
VLDL: 74 mg/dL — ABNORMAL HIGH (ref 0–40)

## 2012-04-18 MED ORDER — NITROGLYCERIN 0.4 MG SL SUBL: 0.4000 mg | SUBLINGUAL_TABLET | SUBLINGUAL | Status: DC | PRN

## 2012-04-18 MED ORDER — ASPIRIN 81 MG PO TBEC: 325.0000 mg | DELAYED_RELEASE_TABLET | Freq: Every day | ORAL | Status: DC

## 2012-04-18 NOTE — Discharge Summary (Signed)
Please see rounding noted as well.

## 2012-04-18 NOTE — Discharge Summary (Signed)
Patient ID: Joanna Park,  MRN: [id number], DOB/AGE: Apr 27, 1929 78 y.o.  Admit date: 04/17/2012 Discharge date: 04/18/2012  Primary Care Provider: Dr. Naaman Plummer Primary Cardiologist: Dr. Leonard Schwartz. [facility]  Discharge Diagnoses Principal Problem:  *Unstable angina  **S/P Cath this admission -> Medical Rx Active Problems:  CAD (coronary artery disease) of artery bypass graft  **Patent grafts by cath this admission.  Ischemic cardiomyopathy  **EF 40-45% by LV gram and echo this admission.  Hyperlipidemia  Hypertension  Cardiac pacemaker in situ  Essential tremor  Allergies Allergies  Allergen Reactions  . Avelox (Moxifloxacin Hcl In Nacl) Hives  . Biaxin (Clarithromycin) Hives  . Ciprofloxacin Hives  . Codeine     hallucinations  . Darvocet (Propoxyphene-Acetaminophen) Hives  . Entex Lq (Phenylephrine-Guaifenesin) Hives  . Iodine Hives  . Ivp Dye (Iodinated Diagnostic Agents) Hives  . Meperidine And Related Other (See Comments)    hallucinations  . Morphine And Related     hallucinations  . Oxycodone Hcl Other (See Comments)    hallucinations  . Phenergan (Promethazine Hcl) Other (See Comments)    hallucinations  . Prednisone Other (See Comments)    Hallucinations  . Zocor (Simvastatin)    Procedures  Cardiac Catheterization 04/17/2012  Procedural Findings: Hemodynamics:  AO  179/81 LV  176/9              Coronary angiography: Coronary dominance: right  Left mainstem: The left mainstem is patent. There is diffuse distal left mainstem stenosis of 70% with heavy calcification.   Left anterior descending (LAD):  the LAD is totally occluded at the ostium. Left circumflex (LCx):  the left circumflex has a single large obtuse marginal branch with no significant stenosis. The origin of the left circumflex at the interface of the left main has diffuse calcified 50-70% stenosis. There is a small first OM branch with subtotal occlusion. Right coronary artery (RCA): totally  occlusion in the proximal segment.  Saphenous vein graft to diagonal branch: Widely patent throughout. There is collateral filling of an intermediate branch. Saphenous vein graft to distal RCA: Widely patent throughout. The PDA and PLA branches are patent. The distal RCA fills retrograde and is severely diseased. LIMA to LAD: This was imaged nonselectively. The subclavian was tortuous. There is no significant subclavian stenosis. The left vertebral artery is very large. The LIMA is patent throughout its course and its anastomotic site to the LAD is patent. The LAD appears small in caliber throughout its course.    Left ventriculography:  there is diffuse LV hypokinesis. The basal inferior wall is severe hypokinesis. The periapical region has moderate hypokinesis. The estimated left ventricular ejection fraction is 40-45%. I do not visualize any significant mitral regurgitation.   Aortic root angiography: There is no significant aortic insufficiency. I do not appreciate any evidence of dissection or aneurysm. _____________  2D Echocardiogram 04/17/2012  Study Conclusions  - Left ventricle: The cavity size was normal. Wall thickness   was increased in a pattern of mild LVH. Systolic function   was mildly to moderately reduced. The estimated ejection   fraction was in the range of 40% to 45%. There is   dyskinesis of the mid-distalanteroseptal myocardium.   Doppler parameters are consistent with abnormal left   ventricular relaxation (grade 1 diastolic dysfunction). - Ventricular septum: Septal motion showed paradox. - Aortic valve: Mildly calcified annulus. Trileaflet. Mild   regurgitation. - Mitral valve: Mildly thickened leaflets . Trivial   regurgitation. - Left atrium: The atrium was mildly dilated. -  Right ventricle: Pacer wire or catheter noted in right   ventricle. - Tricuspid valve: Mild regurgitation. - Pulmonary arteries: PA peak pressure: 35mm Hg (S). - Pericardium,  extracardiac: There was no pericardial   effusion. _____________  History of Present Illness  78 y/o female with h/o CAD s/p CABG who was in her USOH until approximately 11:30 PM on 04/16/2012, when she developed severe substernal chest discomfort with radiation to her neck, associated with dyspnea.  Pain was reminiscent to prior angina.  EMS was called and ECG showed an atrial paced rhythm with LBBB.  It was not known if this represented a new LBBB and given ongoing pain, a Code STEMI was called.  Pt was taken to the [facility] cath lab for further evaluation.  Hospital Course  Pt continued to c/o chest pain upon arrival to the cath lab.  She underwent emergent diagnostic catheterization revealing native multivessel disease with 3/3 patent grafts.  She had a small obtuse marginal branch that was subtotally occluded and was felt to be a possible culprit for her symptoms but given it's relatively small size, medical therapy was recommended.  Following catheterization, pt ruled out for MI.  Echocardiography showed an EF of 40-45%, which was consistent with findings on left ventriculography at the time of catheterization.   She has had no further chest pain or dyspnea and has been ambulating without difficulty.  Her volume status has been stable.  We will discharge her home today in good condition and have recommended that she f/u with her primary cardiologist as an outpatient.  Discharge Vitals Blood pressure 152/75, pulse 66, temperature 98.5 F (36.9 C), temperature source Oral, resp. rate 16, height 5\' 4"  (1.626 m), weight 118 lb 9.7 oz (53.8 kg), SpO2 93.00%.  Filed Weights   04/17/12 0120 04/17/12 0250  Weight: 135 lb (61.236 kg) 118 lb 9.7 oz (53.8 kg)   Labs  CBC  Basename 04/18/12 0535 04/17/12 0108 04/17/12 0101  WBC 5.6 -- 6.4  NEUTROABS -- -- 4.2  HGB 11.5* 13.3 --  HCT 36.2 39.0 --  MCV 89.2 -- 88.2  PLT 141* -- 158   Basic Metabolic Panel  Basename 04/18/12 0535 04/17/12 0108  04/17/12 0100  NA 138 139 --  K 4.0 3.2* --  CL 106 101 --  CO2 26 -- 27  GLUCOSE 96 108* --  BUN 8 7 --  CREATININE 0.70 0.80 --  CALCIUM 8.8 -- 8.8  MG -- -- --  PHOS -- -- --   Cardiac Enzymes  Basename 04/17/12 1235 04/17/12 0650 04/17/12 0100  CKTOTAL -- -- --  CKMB -- -- --  CKMBINDEX -- -- --  TROPONINI <0.30 <0.30 <0.30   Fasting Lipid Panel  Basename 04/18/12 0535  CHOL 238*  HDL 43  LDLCALC 121*  TRIG 368*  CHOLHDL 5.5  LDLDIRECT --   Disposition  Pt is being discharged home today in good condition.  Follow-up Plans & Appointments  Follow-up Information    Follow up with Lamar Blinks, MD. (1-2 wks)    Contact information:   [address] [place] [address] [phone]       Follow up with Yetta Flock, MD. (as scheduled.)    Contact information:   [address]   [place] [address] [phone]        Discharge Medications    Medication List     As of 04/18/2012 11:52 AM    TAKE these medications  aspirin 81 MG EC tablet   Take 4 tablets (325 mg total) by mouth daily.      clopidogrel 75 MG tablet   Commonly known as: PLAVIX   Take 75 mg by mouth daily.      metoprolol 50 MG tablet   Commonly known as: LOPRESSOR   Take 25 mg by mouth 2 (two) times daily.      nitroGLYCERIN 0.4 MG SL tablet   Commonly known as: NITROSTAT   Place 1 tablet (0.4 mg total) under the tongue every 5 (five) minutes x 3 doses as needed for chest pain.      omeprazole 20 MG capsule   Commonly known as: PRILOSEC   Take 20 mg by mouth 2 (two) times daily.      primidone 50 MG tablet   Commonly known as: MYSOLINE   Take 50 mg by mouth 2 (two) times daily.      Outstanding Labs/Studies  None  Duration of Discharge Encounter   Greater than 30 minutes including physician time.  Signed, Nicolasa Ducking NP 04/18/2012, 11:52 AM

## 2012-04-18 NOTE — Progress Notes (Signed)
Patient Name: Joanna Park Date of Encounter: 04/18/2012   Primary cardiologist: Dr. Gwen Pounds [facility])  Principal Problem:  *Unstable angina Active Problems:  CAD (coronary artery disease) of artery bypass graft  Hyperlipidemia  Hypertension  Cardiac pacemaker in situ  Essential tremor   SUBJECTIVE  No chest pain or sob.  Eager to go home.  CURRENT MEDS    . aspirin EC  81 mg Oral Daily  . clopidogrel  75 mg Oral Daily  . influenza  inactive virus vaccine  0.5 mL Intramuscular Tomorrow-1000  . metoprolol  25 mg Oral BID  . pantoprazole  40 mg Oral Daily  . primidone  50 mg Oral BID  . sodium chloride  3 mL Intravenous Q12H   OBJECTIVE  Filed Vitals:   04/17/12 1858 04/17/12 2100 04/17/12 2125 04/18/12 0500  BP: 168/77 147/78 147/78 152/75  Pulse: 64 76 76 66  Temp: 98.2 F (36.8 C) 98.1 F (36.7 C)  98.5 F (36.9 C)  TempSrc: Oral     Resp: 18 16  16   Height:      Weight:      SpO2: 98% 97%  93%   Intake/Output Summary (Last 24 hours) at 04/18/12 0802 Last data filed at 04/18/12 0645  Gross per 24 hour  Intake 1207.75 ml  Output    925 ml  Net 282.75 ml   Filed Weights   04/17/12 0120 04/17/12 0250  Weight: 135 lb (61.236 kg) 118 lb 9.7 oz (53.8 kg)   PHYSICAL EXAM  General: Pleasant, NAD.   Skin: dry/flaking. Rash noted left upper back in horizontal pattern and also mid back in a vertical pattern. Neuro: Alert and oriented X 3. Moves all extremities spontaneously. Psych: Normal affect. HEENT:  Normal  Neck: Supple without bruits or JVD. Lungs:  Resp regular and unlabored.  Crackles left base. Heart: RRR no s3, s4, or murmurs. Abdomen: Soft, non-tender, non-distended, BS + x 4.  Extremities: No clubbing, cyanosis or edema. DP/PT/Radials 2+ and equal bilaterally.  Accessory Clinical Findings  CBC  Basename 04/18/12 0535 04/17/12 0108 04/17/12 0101  WBC 5.6 -- 6.4  NEUTROABS -- -- 4.2  HGB 11.5* 13.3 --  HCT 36.2 39.0 --  MCV 89.2  -- 88.2  PLT 141* -- 158   Basic Metabolic Panel  Basename 04/18/12 0535 04/17/12 0108 04/17/12 0100  NA 138 139 --  K 4.0 3.2* --  CL 106 101 --  CO2 26 -- 27  GLUCOSE 96 108* --  BUN 8 7 --  CREATININE 0.70 0.80 --  CALCIUM 8.8 -- 8.8  MG -- -- --  PHOS -- -- --   Cardiac Enzymes  Basename 04/17/12 1235 04/17/12 0650 04/17/12 0100  CKTOTAL -- -- --  CKMB -- -- --  CKMBINDEX -- -- --  TROPONINI <0.30 <0.30 <0.30   Fasting Lipid Panel  Basename 04/18/12 0535  CHOL 238*  HDL 43  LDLCALC 121*  TRIG 368*  CHOLHDL 5.5  LDLDIRECT --   TELE  Predominantly A paced with AV pacing and a brief period of undersensing during sinus rhythm w/o pacing - spikes falling at terminal portion of qrs.  ECG  A paced.  Radiology/Studies  Dg Chest Port 1 View  04/17/2012  *RADIOLOGY REPORT*  Clinical Data: Chest pain.  Coronary artery disease.  PORTABLE CHEST - 1 VIEW  Comparison: None.  Findings: Heart size is within normal limits.  Coarse interstitial prominence is likely chronic in etiology.  No evidence of acute  infiltrate or pleural effusion.  No evidence of congestive heart failure.  Transvenous pacemaker is seen in appropriate position. Prior CABG noted.  IMPRESSION: No acute findings.   Original Report Authenticated By: Myles Rosenthal, M.D.     ASSESSMENT AND PLAN  1.  USA/CAD:  S/p cath revealing 3vd with patent grafts.  Occlusion of small OM noted though CE have been negative, thus it is not clear that this was culprit of pts symptoms.  Echo performed yesterday and we will need to review this AM.  Provided that this looks ok, we will ambulate pt this AM, and likely d/c later this morning.  She has cardiology f/u in [facility] with Dr. Gwen Pounds @ [facility].  Cont asa, plavix (on @ home), bb.  She is not on a statin and has a listed intolerance to zocor.  Defer initiation of an alternate statin to her outpt cardiologist.  2.  HTN:  Cont bb.  3.  HL:  LDL 121.  Intolerant to  zocor.  Defer alternate statin initiation to her outpt cardiologist.  4.  Rash:  She was treated for shingles 2 mos ago and has had recurrence of rash and itching since.  Her PCP provided her a second course of antiviral, which she has completed.  Rash has recurred.  No pain.  Pattern not consistent with shingles given multiple sites and vertical appearance in some areas.  F/U PCP as outpt.  5.  PPM:  Predominantly A Paced.  Signed, Nicolasa Ducking NP  Attending note:  Patient seen and examined. She is eager to go home, denies any angina with ambulation.  She is comfortable on examination, afebrile, heart rate in the 60s with predominantly atrial paced rhythm by telemetry, blood pressure 152/75. Lungs are clear without labored breathing, cardiac exam with regular rate and rhythm. Lab work reviewed, potassium 4.0, creatinine 0.7, troponin I levels normal, hemoglobin 11.5, platelets 141. LDL 121, not on statin therapy with reported intolerance to Zocor.  Echocardiogram will be reviewed today, likely anticipate discharge home with continued followup in [place] with Dr. Gwen Pounds.  Jonelle Sidle, M.D., F.A.C.C.

## 2012-04-18 NOTE — Progress Notes (Addendum)
Notified by monitor tech of patient's possible failure to sense on telemetry. Patient asymptomatic. Notified Christain Sacramento, NP. Strips posted to paper chart. Will continue to monitor. Earnest Conroy RN

## 2012-04-19 LAB — POCT I-STAT TROPONIN I: Troponin i, poc: 0.02 ng/mL (ref 0.00–0.08)

## 2012-08-24 ENCOUNTER — Inpatient Hospital Stay: Payer: Self-pay | Admitting: Internal Medicine

## 2012-08-24 LAB — CBC
HGB: 10.9 g/dL — ABNORMAL LOW (ref 12.0–16.0)
MCH: 28.8 pg (ref 26.0–34.0)
MCV: 88 fL (ref 80–100)

## 2012-08-24 LAB — URINALYSIS, COMPLETE
Hyaline Cast: 1
Ph: 5 (ref 4.5–8.0)
Squamous Epithelial: 3

## 2012-08-24 LAB — CBC WITH DIFFERENTIAL/PLATELET
Basophil %: 0.8 %
Lymphocyte #: 1.5 10*3/uL (ref 1.0–3.6)
Lymphocyte %: 15.6 %
Monocyte %: 9.9 %
Neutrophil #: 6.9 10*3/uL — ABNORMAL HIGH (ref 1.4–6.5)
Neutrophil %: 73 %
RBC: 3.3 10*6/uL — ABNORMAL LOW (ref 3.80–5.20)
RDW: 13.9 % (ref 11.5–14.5)
WBC: 9.5 10*3/uL (ref 3.6–11.0)

## 2012-08-24 LAB — HEPATIC FUNCTION PANEL A (ARMC)
Albumin: 3.1 g/dL — ABNORMAL LOW (ref 3.4–5.0)
Bilirubin, Direct: <0.1 mg/dL (ref 0.00–0.20)
Bilirubin,Total: 0.4 mg/dL (ref 0.2–1.0)
SGPT (ALT): 13 U/L (ref 12–78)
Total Protein: 6.5 g/dL (ref 6.4–8.2)

## 2012-08-24 LAB — BASIC METABOLIC PANEL
Co2: 30 mmol/L (ref 21–32)
Creatinine: 0.74 mg/dL (ref 0.60–1.30)
EGFR (Non-African Amer.): >60
Glucose: 113 mg/dL — ABNORMAL HIGH (ref 65–99)
Sodium: 139 mmol/L (ref 136–145)

## 2012-08-24 LAB — TROPONIN I
Troponin-I: <0.02 ng/mL
Troponin-I: <0.02 ng/mL

## 2012-08-25 LAB — CBC WITH DIFFERENTIAL/PLATELET
Eosinophil %: 1.1 %
HCT: 27.5 % — ABNORMAL LOW (ref 35.0–47.0)
Lymphocyte #: 2.8 10*3/uL (ref 1.0–3.6)
MCHC: 31.2 g/dL — ABNORMAL LOW (ref 32.0–36.0)
MCV: 89 fL (ref 80–100)
Monocyte #: 0.9 x10 3/mm (ref 0.2–0.9)
Neutrophil #: 6.3 10*3/uL (ref 1.4–6.5)
Platelet: 170 10*3/uL (ref 150–440)
RDW: 13.9 % (ref 11.5–14.5)
WBC: 10.3 10*3/uL (ref 3.6–11.0)

## 2012-08-25 LAB — BASIC METABOLIC PANEL
Anion Gap: 6 — ABNORMAL LOW (ref 7–16)
Calcium, Total: 7.9 mg/dL — ABNORMAL LOW (ref 8.5–10.1)
Co2: 28 mmol/L (ref 21–32)
Creatinine: 0.84 mg/dL (ref 0.60–1.30)
Glucose: 122 mg/dL — ABNORMAL HIGH (ref 65–99)
Osmolality: 276 (ref 275–301)

## 2012-08-25 LAB — HEMOGLOBIN: HGB: 8.1 g/dL — ABNORMAL LOW (ref 12.0–16.0)

## 2012-08-26 LAB — COMPREHENSIVE METABOLIC PANEL
Albumin: 2.7 g/dL — ABNORMAL LOW (ref 3.4–5.0)
Anion Gap: 3 — ABNORMAL LOW (ref 7–16)
BUN: 13 mg/dL (ref 7–18)
Bilirubin,Total: 0.4 mg/dL (ref 0.2–1.0)
Calcium, Total: 8 mg/dL — ABNORMAL LOW (ref 8.5–10.1)
Chloride: 109 mmol/L — ABNORMAL HIGH (ref 98–107)
Creatinine: 0.74 mg/dL (ref 0.60–1.30)
EGFR (African American): >60
Potassium: 3.5 mmol/L (ref 3.5–5.1)
SGOT(AST): 13 U/L — ABNORMAL LOW (ref 15–37)
Total Protein: 5.9 g/dL — ABNORMAL LOW (ref 6.4–8.2)

## 2012-08-26 LAB — CBC WITH DIFFERENTIAL/PLATELET
Basophil %: 0.8 %
Eosinophil #: 0.3 10*3/uL (ref 0.0–0.7)
Eosinophil %: 3.4 %
HGB: 7.9 g/dL — ABNORMAL LOW (ref 12.0–16.0)
Lymphocyte #: 2.1 10*3/uL (ref 1.0–3.6)
MCHC: 33.6 g/dL (ref 32.0–36.0)
Monocyte #: 0.8 x10 3/mm (ref 0.2–0.9)
Monocyte %: 10.6 %
Neutrophil #: 4.4 10*3/uL (ref 1.4–6.5)
Neutrophil %: 57.7 %

## 2012-08-26 LAB — URINE CULTURE

## 2012-08-27 LAB — CBC WITH DIFFERENTIAL/PLATELET
Basophil #: 0 10*3/uL (ref 0.0–0.1)
Eosinophil %: 5.2 %
HCT: 32.7 % — ABNORMAL LOW (ref 35.0–47.0)
Lymphocyte #: 1.6 10*3/uL (ref 1.0–3.6)
Lymphocyte %: 23 %
MCH: 29.3 pg (ref 26.0–34.0)
MCV: 89 fL (ref 80–100)
Monocyte #: 0.8 x10 3/mm (ref 0.2–0.9)
Neutrophil %: 59.9 %

## 2012-08-29 ENCOUNTER — Emergency Department: Payer: Self-pay | Admitting: Emergency Medicine

## 2012-08-29 LAB — COMPREHENSIVE METABOLIC PANEL
Alkaline Phosphatase: 93 U/L (ref 50–136)
BUN: 15 mg/dL (ref 7–18)
Bilirubin,Total: 1.2 mg/dL — ABNORMAL HIGH (ref 0.2–1.0)
Calcium, Total: 8.6 mg/dL (ref 8.5–10.1)
Chloride: 102 mmol/L (ref 98–107)
Creatinine: 0.78 mg/dL (ref 0.60–1.30)
EGFR (African American): >60
EGFR (Non-African Amer.): >60
Osmolality: 272 (ref 275–301)
SGOT(AST): 18 U/L (ref 15–37)
SGPT (ALT): 11 U/L — ABNORMAL LOW (ref 12–78)
Total Protein: 7.1 g/dL (ref 6.4–8.2)

## 2012-08-29 LAB — CBC WITH DIFFERENTIAL/PLATELET
Basophil #: 0.1 10*3/uL (ref 0.0–0.1)
Basophil %: 1.1 %
Eosinophil #: 0.3 10*3/uL (ref 0.0–0.7)
Lymphocyte %: 14.5 %
MCH: 29.4 pg (ref 26.0–34.0)
MCHC: 32.5 g/dL (ref 32.0–36.0)
MCV: 90 fL (ref 80–100)
Monocyte #: 0.8 x10 3/mm (ref 0.2–0.9)
Monocyte %: 10.7 %
Neutrophil #: 5.3 10*3/uL (ref 1.4–6.5)
Platelet: 188 10*3/uL (ref 150–440)
RDW: 14.5 % (ref 11.5–14.5)

## 2012-08-29 LAB — URINALYSIS, COMPLETE
Bilirubin,UR: NEGATIVE
Nitrite: NEGATIVE
Ph: 5 (ref 4.5–8.0)
Protein: NEGATIVE
Specific Gravity: 1.019 (ref 1.003–1.030)
Squamous Epithelial: NONE SEEN

## 2013-09-25 ENCOUNTER — Emergency Department: Payer: Self-pay | Admitting: Emergency Medicine

## 2013-09-25 LAB — CBC
HCT: 43.2 % (ref 35.0–47.0)
HGB: 14 g/dL (ref 12.0–16.0)
MCH: 28.5 pg (ref 26.0–34.0)
MCHC: 32.5 g/dL (ref 32.0–36.0)
MCV: 88 fL (ref 80–100)
Platelet: 245 10*3/uL (ref 150–440)
RBC: 4.93 10*6/uL (ref 3.80–5.20)
RDW: 14.4 % (ref 11.5–14.5)
WBC: 13 10*3/uL — ABNORMAL HIGH (ref 3.6–11.0)

## 2013-09-25 LAB — TROPONIN I: Troponin-I: 0.03 ng/mL

## 2013-09-25 LAB — URINALYSIS, COMPLETE
BACTERIA: NONE SEEN
Bilirubin,UR: NEGATIVE
Glucose,UR: NEGATIVE mg/dL (ref 0–75)
Ketone: NEGATIVE
Leukocyte Esterase: NEGATIVE
Nitrite: NEGATIVE
Ph: 6 (ref 4.5–8.0)
Protein: NEGATIVE
RBC,UR: 3 /HPF (ref 0–5)
SPECIFIC GRAVITY: 1.012 (ref 1.003–1.030)
WBC UR: <1 /HPF (ref 0–5)

## 2013-09-25 LAB — CK TOTAL AND CKMB (NOT AT ARMC)
CK, Total: 286 U/L — ABNORMAL HIGH
CK-MB: 3.5 ng/mL (ref 0.5–3.6)

## 2013-09-25 LAB — COMPREHENSIVE METABOLIC PANEL
ALBUMIN: 3 g/dL — AB (ref 3.4–5.0)
ANION GAP: 8 (ref 7–16)
AST: 37 U/L (ref 15–37)
Alkaline Phosphatase: 103 U/L
BUN: 12 mg/dL (ref 7–18)
Bilirubin,Total: 0.9 mg/dL (ref 0.2–1.0)
Calcium, Total: 8.9 mg/dL (ref 8.5–10.1)
Chloride: 99 mmol/L (ref 98–107)
Co2: 28 mmol/L (ref 21–32)
Creatinine: 0.72 mg/dL (ref 0.60–1.30)
EGFR (African American): >60
Glucose: 120 mg/dL — ABNORMAL HIGH (ref 65–99)
Osmolality: 271 (ref 275–301)
Potassium: 4 mmol/L (ref 3.5–5.1)
SGPT (ALT): 23 U/L (ref 12–78)
Sodium: 135 mmol/L — ABNORMAL LOW (ref 136–145)
Total Protein: 8.3 g/dL — ABNORMAL HIGH (ref 6.4–8.2)

## 2013-09-26 ENCOUNTER — Observation Stay: Payer: Self-pay | Admitting: Internal Medicine

## 2013-09-26 LAB — BASIC METABOLIC PANEL
ANION GAP: 6 — AB (ref 7–16)
BUN: 16 mg/dL (ref 7–18)
CREATININE: 0.66 mg/dL (ref 0.60–1.30)
Calcium, Total: 8.5 mg/dL (ref 8.5–10.1)
Chloride: 103 mmol/L (ref 98–107)
Co2: 30 mmol/L (ref 21–32)
EGFR (African American): >60
EGFR (Non-African Amer.): >60
Glucose: 66 mg/dL (ref 65–99)
Osmolality: 277 (ref 275–301)
Potassium: 4.2 mmol/L (ref 3.5–5.1)
Sodium: 139 mmol/L (ref 136–145)

## 2013-09-26 LAB — CBC
HCT: 38.7 % (ref 35.0–47.0)
HGB: 12.4 g/dL (ref 12.0–16.0)
MCH: 28.3 pg (ref 26.0–34.0)
MCHC: 32.1 g/dL (ref 32.0–36.0)
MCV: 88 fL (ref 80–100)
Platelet: 212 10*3/uL (ref 150–440)
RBC: 4.39 10*6/uL (ref 3.80–5.20)
RDW: 14.4 % (ref 11.5–14.5)
WBC: 9.1 10*3/uL (ref 3.6–11.0)

## 2013-09-26 LAB — URINALYSIS, COMPLETE
Bacteria: NONE SEEN
Bilirubin,UR: NEGATIVE
Glucose,UR: NEGATIVE mg/dL (ref 0–75)
Ketone: NEGATIVE
Leukocyte Esterase: NEGATIVE
Nitrite: NEGATIVE
PROTEIN: NEGATIVE
Ph: 5 (ref 4.5–8.0)
RBC,UR: 1 /HPF (ref 0–5)
SQUAMOUS EPITHELIAL: NONE SEEN
Specific Gravity: 1.017 (ref 1.003–1.030)
WBC UR: <1 /HPF (ref 0–5)

## 2013-09-26 LAB — TROPONIN I: Troponin-I: <0.02 ng/mL

## 2013-09-28 LAB — CBC WITH DIFFERENTIAL/PLATELET
Basophil #: 0.1 10*3/uL (ref 0.0–0.1)
Basophil %: 0.8 %
EOS PCT: 10.1 %
Eosinophil #: 0.7 10*3/uL (ref 0.0–0.7)
HCT: 35.4 % (ref 35.0–47.0)
HGB: 11.4 g/dL — ABNORMAL LOW (ref 12.0–16.0)
LYMPHS PCT: 26.6 %
Lymphocyte #: 1.7 10*3/uL (ref 1.0–3.6)
MCH: 28.5 pg (ref 26.0–34.0)
MCHC: 32.2 g/dL (ref 32.0–36.0)
MCV: 89 fL (ref 80–100)
Monocyte #: 0.7 x10 3/mm (ref 0.2–0.9)
Monocyte %: 11.3 %
NEUTROS ABS: 3.3 10*3/uL (ref 1.4–6.5)
Neutrophil %: 51.2 %
PLATELETS: 191 10*3/uL (ref 150–440)
RBC: 4 10*6/uL (ref 3.80–5.20)
RDW: 14.2 % (ref 11.5–14.5)
WBC: 6.5 10*3/uL (ref 3.6–11.0)

## 2013-11-10 ENCOUNTER — Inpatient Hospital Stay: Payer: Self-pay | Admitting: Internal Medicine

## 2013-11-10 LAB — COMPREHENSIVE METABOLIC PANEL
ALBUMIN: 2.8 g/dL — AB (ref 3.4–5.0)
ANION GAP: 7 (ref 7–16)
AST: 58 U/L — AB (ref 15–37)
Alkaline Phosphatase: 120 U/L — ABNORMAL HIGH
BUN: 16 mg/dL (ref 7–18)
Bilirubin,Total: 0.8 mg/dL (ref 0.2–1.0)
CO2: 27 mmol/L (ref 21–32)
Calcium, Total: 9.1 mg/dL (ref 8.5–10.1)
Chloride: 101 mmol/L (ref 98–107)
Creatinine: 0.61 mg/dL (ref 0.60–1.30)
EGFR (African American): >60
EGFR (Non-African Amer.): >60
GLUCOSE: 140 mg/dL — AB (ref 65–99)
Osmolality: 274 (ref 275–301)
Potassium: 4.1 mmol/L (ref 3.5–5.1)
SGPT (ALT): 43 U/L (ref 12–78)
Sodium: 135 mmol/L — ABNORMAL LOW (ref 136–145)
TOTAL PROTEIN: 7.8 g/dL (ref 6.4–8.2)

## 2013-11-10 LAB — CBC
HCT: 37.5 % (ref 35.0–47.0)
HGB: 12.1 g/dL (ref 12.0–16.0)
MCH: 28.4 pg (ref 26.0–34.0)
MCHC: 32.4 g/dL (ref 32.0–36.0)
MCV: 88 fL (ref 80–100)
Platelet: 203 10*3/uL (ref 150–440)
RBC: 4.28 10*6/uL (ref 3.80–5.20)
RDW: 15.9 % — ABNORMAL HIGH (ref 11.5–14.5)
WBC: 15.6 10*3/uL — ABNORMAL HIGH (ref 3.6–11.0)

## 2013-11-10 LAB — URINALYSIS, COMPLETE
Bacteria: NONE SEEN
Bilirubin,UR: NEGATIVE
Glucose,UR: NEGATIVE mg/dL (ref 0–75)
Ketone: NEGATIVE
Leukocyte Esterase: NEGATIVE
Nitrite: NEGATIVE
PH: 5 (ref 4.5–8.0)
Protein: NEGATIVE
RBC,UR: 14 /HPF (ref 0–5)
Specific Gravity: 1.018 (ref 1.003–1.030)
WBC UR: 1 /HPF (ref 0–5)

## 2013-11-10 LAB — TROPONIN I: Troponin-I: <0.02 ng/mL

## 2013-11-11 LAB — BASIC METABOLIC PANEL
Anion Gap: 8 (ref 7–16)
BUN: 13 mg/dL (ref 7–18)
CALCIUM: 8.5 mg/dL (ref 8.5–10.1)
CHLORIDE: 102 mmol/L (ref 98–107)
Co2: 26 mmol/L (ref 21–32)
Creatinine: 0.61 mg/dL (ref 0.60–1.30)
EGFR (African American): >60
GLUCOSE: 127 mg/dL — AB (ref 65–99)
Osmolality: 274 (ref 275–301)
Potassium: 3.7 mmol/L (ref 3.5–5.1)
SODIUM: 136 mmol/L (ref 136–145)

## 2013-11-11 LAB — CBC WITH DIFFERENTIAL/PLATELET
Basophil #: 0 10*3/uL (ref 0.0–0.1)
Basophil %: 0.3 %
EOS PCT: 0.1 %
Eosinophil #: 0 10*3/uL (ref 0.0–0.7)
HCT: 33.4 % — AB (ref 35.0–47.0)
HGB: 10.9 g/dL — AB (ref 12.0–16.0)
LYMPHS PCT: 9.1 %
Lymphocyte #: 1.4 10*3/uL (ref 1.0–3.6)
MCH: 28.4 pg (ref 26.0–34.0)
MCHC: 32.8 g/dL (ref 32.0–36.0)
MCV: 87 fL (ref 80–100)
MONO ABS: 1.6 x10 3/mm — AB (ref 0.2–0.9)
Monocyte %: 10.6 %
NEUTROS ABS: 12.3 10*3/uL — AB (ref 1.4–6.5)
Neutrophil %: 79.9 %
Platelet: 179 10*3/uL (ref 150–440)
RBC: 3.85 10*6/uL (ref 3.80–5.20)
RDW: 15.3 % — ABNORMAL HIGH (ref 11.5–14.5)
WBC: 15.4 10*3/uL — ABNORMAL HIGH (ref 3.6–11.0)

## 2013-11-13 LAB — BASIC METABOLIC PANEL
Anion Gap: 7 (ref 7–16)
BUN: 5 mg/dL — AB (ref 7–18)
CALCIUM: 8.5 mg/dL (ref 8.5–10.1)
CHLORIDE: 100 mmol/L (ref 98–107)
Co2: 28 mmol/L (ref 21–32)
Creatinine: 0.57 mg/dL — ABNORMAL LOW (ref 0.60–1.30)
Glucose: 96 mg/dL (ref 65–99)
Osmolality: 267 (ref 275–301)
Potassium: 3.8 mmol/L (ref 3.5–5.1)
Sodium: 135 mmol/L — ABNORMAL LOW (ref 136–145)

## 2013-11-13 LAB — CBC WITH DIFFERENTIAL/PLATELET
BASOS ABS: 0.1 10*3/uL (ref 0.0–0.1)
Basophil %: 0.5 %
EOS PCT: 1.8 %
Eosinophil #: 0.2 10*3/uL (ref 0.0–0.7)
HCT: 30.9 % — ABNORMAL LOW (ref 35.0–47.0)
HGB: 10.1 g/dL — ABNORMAL LOW (ref 12.0–16.0)
Lymphocyte #: 1.2 10*3/uL (ref 1.0–3.6)
Lymphocyte %: 12.7 %
MCH: 28.3 pg (ref 26.0–34.0)
MCHC: 32.6 g/dL (ref 32.0–36.0)
MCV: 87 fL (ref 80–100)
MONO ABS: 1.3 x10 3/mm — AB (ref 0.2–0.9)
Monocyte %: 13 %
NEUTROS ABS: 7 10*3/uL — AB (ref 1.4–6.5)
Neutrophil %: 72 %
Platelet: 186 10*3/uL (ref 150–440)
RBC: 3.56 10*6/uL — AB (ref 3.80–5.20)
RDW: 15.3 % — ABNORMAL HIGH (ref 11.5–14.5)
WBC: 9.8 10*3/uL (ref 3.6–11.0)

## 2013-11-13 LAB — VANCOMYCIN, TROUGH: Vancomycin, Trough: 5 ug/mL — ABNORMAL LOW (ref 10–20)

## 2013-11-14 LAB — CBC WITH DIFFERENTIAL/PLATELET
BASOS ABS: 0 10*3/uL (ref 0.0–0.1)
Basophil %: 0.5 %
EOS PCT: 2.6 %
Eosinophil #: 0.2 10*3/uL (ref 0.0–0.7)
HCT: 29 % — ABNORMAL LOW (ref 35.0–47.0)
HGB: 9.5 g/dL — AB (ref 12.0–16.0)
Lymphocyte #: 1.3 10*3/uL (ref 1.0–3.6)
Lymphocyte %: 15.2 %
MCH: 28.4 pg (ref 26.0–34.0)
MCHC: 32.8 g/dL (ref 32.0–36.0)
MCV: 87 fL (ref 80–100)
MONOS PCT: 14.4 %
Monocyte #: 1.2 x10 3/mm — ABNORMAL HIGH (ref 0.2–0.9)
Neutrophil #: 5.8 10*3/uL (ref 1.4–6.5)
Neutrophil %: 67.3 %
Platelet: 192 10*3/uL (ref 150–440)
RBC: 3.35 10*6/uL — AB (ref 3.80–5.20)
RDW: 15.5 % — ABNORMAL HIGH (ref 11.5–14.5)
WBC: 8.6 10*3/uL (ref 3.6–11.0)

## 2013-11-14 LAB — BASIC METABOLIC PANEL
Anion Gap: 3 — ABNORMAL LOW (ref 7–16)
BUN: 5 mg/dL — AB (ref 7–18)
CO2: 28 mmol/L (ref 21–32)
Calcium, Total: 8.9 mg/dL (ref 8.5–10.1)
Chloride: 102 mmol/L (ref 98–107)
Creatinine: 0.66 mg/dL (ref 0.60–1.30)
EGFR (African American): >60
EGFR (Non-African Amer.): >60
GLUCOSE: 102 mg/dL — AB (ref 65–99)
OSMOLALITY: 264 (ref 275–301)
Potassium: 3.6 mmol/L (ref 3.5–5.1)
Sodium: 133 mmol/L — ABNORMAL LOW (ref 136–145)

## 2013-11-15 LAB — COMPREHENSIVE METABOLIC PANEL
ALBUMIN: 2 g/dL — AB (ref 3.4–5.0)
ALT: 16 U/L (ref 12–78)
AST: 19 U/L (ref 15–37)
Alkaline Phosphatase: 106 U/L
Anion Gap: 5 — ABNORMAL LOW (ref 7–16)
BUN: 8 mg/dL (ref 7–18)
Bilirubin,Total: 0.4 mg/dL (ref 0.2–1.0)
CALCIUM: 9.1 mg/dL (ref 8.5–10.1)
CREATININE: 0.78 mg/dL (ref 0.60–1.30)
Chloride: 103 mmol/L (ref 98–107)
Co2: 31 mmol/L (ref 21–32)
EGFR (African American): >60
Glucose: 92 mg/dL (ref 65–99)
OSMOLALITY: 276 (ref 275–301)
Potassium: 3.3 mmol/L — ABNORMAL LOW (ref 3.5–5.1)
SODIUM: 139 mmol/L (ref 136–145)
Total Protein: 6.8 g/dL (ref 6.4–8.2)

## 2013-11-15 LAB — CULTURE, BLOOD (SINGLE)

## 2013-11-16 LAB — CULTURE, BLOOD (SINGLE)

## 2014-05-16 DEATH — deceased

## 2014-05-25 ENCOUNTER — Encounter (HOSPITAL_COMMUNITY): Payer: Self-pay | Admitting: Cardiovascular Disease

## 2014-07-20 DIAGNOSIS — I25118 Atherosclerotic heart disease of native coronary artery with other forms of angina pectoris: Secondary | ICD-10-CM | POA: Diagnosis not present

## 2014-07-20 DIAGNOSIS — I495 Sick sinus syndrome: Secondary | ICD-10-CM | POA: Diagnosis not present

## 2014-07-20 DIAGNOSIS — E782 Mixed hyperlipidemia: Secondary | ICD-10-CM | POA: Diagnosis not present

## 2014-07-20 DIAGNOSIS — I071 Rheumatic tricuspid insufficiency: Secondary | ICD-10-CM | POA: Diagnosis not present

## 2014-07-20 DIAGNOSIS — Z8679 Personal history of other diseases of the circulatory system: Secondary | ICD-10-CM | POA: Diagnosis not present

## 2014-08-16 DIAGNOSIS — I4891 Unspecified atrial fibrillation: Secondary | ICD-10-CM | POA: Diagnosis not present

## 2014-08-16 DIAGNOSIS — D649 Anemia, unspecified: Secondary | ICD-10-CM | POA: Diagnosis not present

## 2014-08-16 DIAGNOSIS — F0390 Unspecified dementia without behavioral disturbance: Secondary | ICD-10-CM | POA: Diagnosis not present

## 2014-08-16 DIAGNOSIS — I2510 Atherosclerotic heart disease of native coronary artery without angina pectoris: Secondary | ICD-10-CM | POA: Diagnosis not present

## 2014-08-22 DIAGNOSIS — E785 Hyperlipidemia, unspecified: Secondary | ICD-10-CM | POA: Diagnosis not present

## 2014-08-22 DIAGNOSIS — R001 Bradycardia, unspecified: Secondary | ICD-10-CM | POA: Diagnosis not present

## 2014-09-07 DIAGNOSIS — I509 Heart failure, unspecified: Secondary | ICD-10-CM | POA: Diagnosis not present

## 2014-09-07 DIAGNOSIS — I480 Paroxysmal atrial fibrillation: Secondary | ICD-10-CM | POA: Diagnosis not present

## 2014-09-07 DIAGNOSIS — K219 Gastro-esophageal reflux disease without esophagitis: Secondary | ICD-10-CM | POA: Diagnosis not present

## 2014-09-07 DIAGNOSIS — I2510 Atherosclerotic heart disease of native coronary artery without angina pectoris: Secondary | ICD-10-CM | POA: Diagnosis not present

## 2014-09-07 DIAGNOSIS — F0280 Dementia in other diseases classified elsewhere without behavioral disturbance: Secondary | ICD-10-CM | POA: Diagnosis not present

## 2014-09-07 DIAGNOSIS — E789 Disorder of lipoprotein metabolism, unspecified: Secondary | ICD-10-CM | POA: Diagnosis not present

## 2014-09-07 DIAGNOSIS — G309 Alzheimer's disease, unspecified: Secondary | ICD-10-CM | POA: Diagnosis not present

## 2014-09-07 DIAGNOSIS — M150 Primary generalized (osteo)arthritis: Secondary | ICD-10-CM | POA: Diagnosis not present

## 2014-09-07 DIAGNOSIS — I110 Hypertensive heart disease with heart failure: Secondary | ICD-10-CM | POA: Diagnosis not present

## 2014-09-21 DIAGNOSIS — J018 Other acute sinusitis: Secondary | ICD-10-CM | POA: Diagnosis not present

## 2014-10-06 NOTE — Consult Note (Signed)
Brief Consult Note: Diagnosis: CAD, no CP, neg troponin.   Patient was seen by consultant.   Consult note dictated.   Comments: REC  Agree with current therapy, may hold clopidogrel for now, may need to consider starting anticoagulation for AF, CHADS2 2 at later date per DR Gwen PoundsKowalski as out-patient, no further cardiac diagnostics at this time.  Electronic Signatures: Marcina MillardParaschos, Alexander (MD)  (Signed 12-Mar-14 13:49)  Authored: Brief Consult Note   Last Updated: 12-Mar-14 13:49 by Marcina MillardParaschos, Alexander (MD)

## 2014-10-06 NOTE — Consult Note (Signed)
PATIENT NAME:  Joanna Park, Joanna Park MR#:  [id number] DATE OF BIRTH:  02-Dec-1928  DATE OF CONSULTATION:  08/25/2012  CONSULTING PHYSICIAN:  Marcina Millard, MD  PRIMARY CARE PHYSICIAN: Danella Penton, MD  CARDIOLOGIST: Lamar Blinks, MD  CHIEF COMPLAINT: Dizziness.   REASON FOR CONSULTATION: Consultation requested for evaluation of coronary artery disease.   HISTORY OF PRESENT ILLNESS: The patient is an 81 year old female with known history of coronary artery disease, status post coronary artery bypass graft surgery. The patient has sick sinus syndrome, status post pacemaker. She presented to [facility] Emergency Room on 08/24/2012 with 1-week history of progressive nausea, abdominal discomfort, pleuritic type chest pain with deep breaths. CT scan was performed which revealed a large anterior wall hematoma. Hemoglobin was 10.9. The patient was discharged from the ER but then experienced a presyncopal episode in the parking lot. Head CT was unremarkable. The patient was admitted to telemetry where she denies chest pain or shortness of breath.   PAST MEDICAL HISTORY:  1.  Status post anterior myocardial infarction, March 2004. 2.  Status post CABG with LIMA to LAD and SVG to D1 and RCA.  3.  Status post stent to SVG to RCA in January 2012.  4.  Hypertension.  5.  Hyperlipidemia.  6.  Dual-chamber pacemaker implantation in September 1997. 7.  Pacemaker generator change out in March 2010. 8.  Intermittent atrial fibrillation.   MEDICATIONS ON ADMISSION: Aspirin 81 mg daily, metoprolol 50 mg b.i.d., clopidogrel 75 mg daily, primidone 50 mg at bedtime, Robaxin t.i.d. p.r.n., Mucinex q.12 p.r.n.   SOCIAL HISTORY: The patient currently lives in [place]. She lives with her husband. She denies tobacco abuse.   FAMILY HISTORY: Positive for coronary artery disease and prior myocardial infarction.   REVIEW OF SYSTEMS:    CONSTITUTIONAL: No fever or chills.  EYES: No blurry vision.  EARS: No  hearing loss.  RESPIRATORY: Pleuritic type chest pain.  CARDIOVASCULAR: No angina.  GASTROINTESTINAL: The patient has had some nausea and abdominal discomfort.  GENITOURINARY: No dysuria or hematuria.  ENDOCRINE: No polyuria or polydipsia.  MUSCULOSKELETAL: No arthralgias or myalgias.  NEUROLOGICAL: The patient has had some dizziness.  PSYCHIATRIC: No depression or anxiety.   PHYSICAL EXAMINATION:  VITAL SIGNS: Blood pressure 144/70, pulse 61, respirations 20, temperature 98.2, pulse oximetry 97%.  HEENT: Pupils equal, reactive to light and accommodation.  NECK: Supple without thyromegaly.  LUNGS: Clear.  HEART: Normal JVP. Normal PMI. Regular rate and rhythm. Normal S1, S2. No appreciable gallop, murmur or rub.  ABDOMEN: Soft and nontender.  EXTREMITIES: Pulses were intact bilaterally.  MUSCULOSKELETAL: Normal muscle tone.  NEUROLOGIC: The patient is alert and oriented x 3. Motor and sensory both grossly intact.   IMPRESSION: An 80 year old female who presents with abdominal discomfort, found to have anterior wall hematoma of uncertain clinical significance. The patient is currently on clopidogrel. The patient does have a history of intermittent atrial fibrillation with a CHADS2 score of 2 and would be a candidate for chronic anticoagulation normally. The patient has ruled out for myocardial infarction by CPK isoenzymes and troponin and currently denies chest pain.   RECOMMENDATIONS:  1.  Agree with overall current therapy.  2.  Agree with holding clopidogrel for now.  3.  May consider starting chronic anticoagulation in the future for stroke prevention. This could be done as an outpatient with Dr. Gwen Pounds.  4.  Would defer further cardiac diagnostics at this time.    ____________________________ Marcina Millard, MD ap:jm D: 08/25/2012 13:47:28  ET T: 08/25/2012 14:04:18 ET JOB#: [id number]  cc: Marcina MillardAlexander Paraschos, MD, <Dictator> Marcina MillardALEXANDER PARASCHOS MD ELECTRONICALLY SIGNED  09/15/2012 12:26

## 2014-10-06 NOTE — H&P (Signed)
PATIENT NAME:  Joanna Park, Joanna Park MR#:  [id number] DATE OF BIRTH:  24-Oct-1928  DATE OF ADMISSION:  08/24/2012  REFERRING PHYSICIAN:  Dr. Shaune Pollack.   PRIMARY CARE PHYSICIAN:  Dr. Hyacinth Meeker.  CHIEF COMPLAINT:  Abdominal pain.   HISTORY OF PRESENT ILLNESS:  The patient is a pleasant 81 year old Caucasian female with history of CAD status post MI and stenting, sick sinus syndrome, status post pacemaker, hyperlipidemia, chronic back pain and intermittent A-Fib, who is here for abdominal pain.  The patient stated that the pain started about a week ago, has been progressive associated with nausea.  A couple of days ago it got worse and today it got increasingly worse.  She also has some pleuritic chest pains and an area with deep breaths.  She was seen in the ED initially and was noted to have by mouth tolerance.  Her work-up had shown a large anterior wall hematoma with multiple small lobular hyperdense foci in adjacent anterior mesenteric fat which was probably hematomas as well.  Her hemoglobin was noted to be 10.9 initially.  She was discharged from the ER, went to the parking lot where she had a presyncopal episode, came back with nausea, feeling weak.  CT of the head did not show any acute events and hospitalist service were contacted for further evaluation and management.  The patient was noted to have a drop in hemoglobin to 9.7 from 10.9 and patient also has some positional dizziness.   PAST MEDICAL HISTORY:  CAD status post MI and CABG, 3-vessel, in 2004, sick sinus syndrome status post pacemaker, hyperlipidemia, osteoarthritis, chronic back pain, spinal stenosis, degenerative disk disease, GERD, post herpetic neuralgia, intermittent A-Fib.   PAST SURGICAL HISTORY:  Cervical surgery x 7, cholecystectomy, appendectomy, hysterectomy, CAD status post CABG and pacemaker placement.   ALLERGIES:  BIAXIN, AVELOX, BETADINE, CIPRO, CODEINE, DARVOCET, DEMEROL, ENTEX, IODINE, ISMO DYE, MORPHINE, OXYCODONE, PHENERGAN,  ZOCOR.  OUTPATIENT MEDICATIONS:  Benazepril 5 mg daily, Carafate 1 gram 4 times a day, metoprolol 25 mg 2 times a day, Plavix 75 mg daily, Prilosec 20 mg 2 times a day, primidone 50 mg 2 times a day, Zofran ODT as needed.   FAMILY HISTORY:  Multiple family members with MI and CAD.  A sister with kidney issues.   SOCIAL HISTORY:  No alcohol, tobacco or drug use.  Lives in [place].   REVIEW OF SYSTEMS:  CONSTITUTIONAL:  No fever.  Some fatigue.  EYES:  No blurry vision or double vision.  EARS, NOSE, THROAT:  No tinnitus or hearing loss.  Some chronic rhinorrhea.  RESPIRATORY:  Has pleuritic chest pain.  No cough or wheezing.  No shortness of breath now, has some chronic off and on shortness of breath.  CARDIOVASCULAR:  No chest pain.  No swelling in the legs, has history of paroxysmal A-Fib.  No dyspnea on exertion.  GASTROINTESTINAL:  Positive for nausea without vomiting or diarrhea.  Positive for abdominal pain.  No black or tarry stools.  No bloody stools.  Positive for GERD.  GENITOURINARY:  Denies dysuria, hematuria. HEMATOLOGY AND LYMPHATIC:  No anemia or easy bruising.  SKIN:  No rashes.  MUSCULOSKELETAL:  Has chronic arthritis and back pain.  NEUROLOGIC:  No focal numbness or weakness.  PSYCHIATRIC:  No anxiety or insomnia.   PHYSICAL EXAMINATION: VITAL SIGNS:  Temperature on arrival earlier in the afternoon 97, pulse 64, respiratory rate 20, blood pressure 132/70, last blood pressure 140/72, O2 sat 95% on room air.  GENERAL:  The patient is  an elderly Caucasian female lying in bed in mild to moderate pain.  HEENT:  Normocephalic, atraumatic.  Pupils are equal and reactive.  Anicteric sclerae.  Extraocular muscles intact.  Moist mucous membranes.  NECK:  Supple.  No thyroid tenderness or cervical lymphadenopathy.  CARDIOVASCULAR:  S1, S2, tachycardic.  No murmurs, rubs or gallops.  LUNGS:  Clear to auscultation anterior fields.  Cannot auscultate posteriorly given significant  pain with movement.  ABDOMEN:  Epigastric pain on palpation.  No rebound or guarding.  Positive bowel sounds all quadrants.  EXTREMITIES:  No significant lower extremity edema.  NEUROLOGIC:  Cranial nerves II through XII appears to be grossly intact.  Strength is 5 out of 5 all extremities.  Sensation is intact to light touch.  PSYCHIATRIC:  Somewhat anxious, awake, alert, oriented x 3.   LABORATORY DATA:  Glucose 113, BUN 10, creatinine 0.74, sodium 139, potassium 3.8.  LFTs, albumin is 3.1, otherwise within normal limits.  Troponins were negative x 2.  Hemoglobin initially 10.9, then 9.7, platelets 164.  WBC on arrival was 8.6.  UA not suggestive of infection.  EKG shows a paced rhythm and left bundle branch block, rate 61.   ASSESSMENT AND PLAN:  We have an 81 year old Caucasian female with intermittent atrial fibrillation, chronic back pain, sick sinus syndrome status post pacemaker, coronary artery disease status post stenting and coronary artery bypass grafting on Plavix, here for abdominal pain, was noted to have a significant abdominal wall hematoma with mesenteric hematomas as well, drop in hemoglobin with positional dizziness, likely orthostatic hypotension and a drop in hemoglobin, likely post hemorrhagic anemia.  Would admit the patient to the hospital.  Hold the Plavix.  The patient likely had a spontaneous hematoma as there is no inciting factors.  We will check orthostatic vital signs for the presyncope and start the patient on some gentle fluids.  Check hemoglobin q. 8 hours.  I will hold the Plavix and obtain a cardiology consult.  The patient is ALLERGIC TO MULTIPLE PAIN MEDICATIONS, but states that she is not allergic to Tylenol which I would start.  If the hemoglobin trends down or if patient's pain progresses or is persistent, would consider repeat imaging and surgical consult.  Would resume the beta-blocker.  She has no active chest pains.  The anemia is likely secondary to above.  We  will start the patient on some Zofran as needed.  Place her on Teds and SCDs for deep vein thrombosis prophylaxis given the hematomas.   CODE STATUS:  THE PATIENT IS FULL CODE.   TOTAL TIME SPENT:  Is 50 minutes.     ____________________________ Krystal EatonShayiq Ahmadzia, MD sa:ea D: 08/24/2012 22:02:39 ET T: 08/24/2012 22:31:52 ET JOB#: [id number]  cc: Krystal EatonShayiq Ahmadzia, MD, <Dictator> Danella PentonMark F. Miller, MD Dr. Adele DanKowalski SHAYIQ [facility] IrvineHMADZIA MD ELECTRONICALLY SIGNED 09/07/2012 13:26

## 2014-10-06 NOTE — Discharge Summary (Signed)
PATIENT NAME:  Joanna Park, Joanna Park MR#:  [id number] DATE OF BIRTH:  30-Aug-1928  DATE OF ADMISSION:  08/24/2012 DATE OF DISCHARGE:    DISCHARGE DIAGNOSES: 1. Acute abdominal wall hemorrhage, rectus sheath hematoma.  2. Symptomatic anemia.  3. Coronary artery disease.  4. Intermittent atrial fibrillation.  5. Osteoarthritis.  6. Gastroesophageal reflux disease.  7. Lumbar spinal stenosis.   DISCHARGE MEDICATIONS:  1. Benazepril 5 mg daily.  2. Carafate 1 gram q.i.d. 3. Metoprolol 25 mg b.i.d.  4. Prilosec 20 mg b.i.d. 5. Primidone 50 mg b.i.d.  6. Vitron-Park 1 daily.  REASON FOR ADMISSION: An 81 year old female presents with near-syncope and abdominal wall bleed. Please see H and P for HPI, past medical history and physical exam.   HOSPITAL COURSE: The patient was admitted, found to have a large rectus sheath hematoma on abdominal pelvic CT. Her hemoglobin dropped from 10 down to 7.9. Because of her coronary disease, shortness of breath and acute blood loss, she was transfused with 1 unit PRBC. She is symptomatically better, stable off Plavix and aspirin. Down the road, can restart these depending on her clinical course. She will follow up with Dr. Hyacinth MeekerMiller in 1 week.   PROGNOSIS: Reasonable.   HOME HEALTH: Because of her instability on her feet and complex care, she will get home health physical therapy and nursing.    ____________________________ Danella PentonMark F. Miller, MD mfm:OSi D: 08/27/2012 07:47:57 ET T: 08/27/2012 08:22:16 ET JOB#: [id number]  cc: Danella PentonMark F. Miller, MD, <Dictator> MARK Sherlene ShamsF MILLER MD ELECTRONICALLY SIGNED 08/31/2012 8:07

## 2014-10-07 NOTE — H&P (Signed)
PATIENT NAME:  Joanna Park, Joanna Park MR#:  [id number] DATE OF BIRTH:  12-09-1928  DATE OF ADMISSION:  11/10/2013  PRIMARY CARE PHYSICIAN: Dr. Bethann Punches at [facility]   REFERRING PHYSICIAN: Dr. Fanny Bien.   CHIEF COMPLAINT: Shortness of breath.   HISTORY OF PRESENT ILLNESS: Ms. Rickles is an 81 year old, pleasant, white female with past medical history of coronary artery disease status post coronary artery bypass grafting, paroxysmal atrial fibrillation. Recently had a fall and sustained a pelvic fracture. Has been staying at [facility]. The patient was doing well until yesterday. Since this morning has been having cough with productive sputum. Was noted to have a fever of greater than 103. Concerning this, EMS was called. When EMS arrived, the patient had oxygen saturations of 86% on room air. Concerning this, the patient was brought to the Emergency Department. Chest x-ray shows bilateral lower lobe pneumonia. The patient received vancomycin and levofloxacin. The patient has underlying dementia. The history is mainly obtained from the patient's daughter. Denies having any chest pain, palpations.   PAST MEDICAL HISTORY:  1. Coronary artery disease, status post coronary artery bypass grafting.  2. Sick sinus syndrome, status post pacemaker placement.  3. Hypertension.  4. Hyperlipidemia.  5. Osteoarthritis.  6. Degenerative joint disease.  7. Spinal stenosis.  8. Paroxysmal in atrial fibrillation.   ALLERGIES:  1. AVELOX.  2. BETADINE.   3. BIAXIN.  4. CIPRO.  5. CODEINE.  6. DARVOCET.  7. DEMEROL.  8. .   9. IODINE.  10. IV CONTRAST DYE.   SOCIAL HISTORY: No history of smoking, drinking alcohol or using illicit drugs. Has been having poor functional ability.   FAMILY HISTORY: Positive for coronary artery disease.   REVIEW OF SYSTEMS:  CONSTITUTIONAL: Generalized weakness.  EYES: No change in vision.  ENT: No change in hearing.  RESPIRATORY: Has cough, shortness of breath.   CARDIOVASCULAR: No chest pain, palpations.  GASTROINTESTINAL: No nausea, vomiting, abdominal pain.  GENITOURINARY: No dysuria or hematochezia.  HEMATOLOGIC: No easy bruising or bleeding.  ENDOCRINE: No polyuria or polydipsia.  SKIN: No rash or lesions.  MUSCULOSKELETAL: Generalized body aches.  NEUROLOGIC: No  or numbness in any part of the body.   PHYSICAL EXAMINATION:  GENERAL: This is a thin-built female lying down in the bed, not in distress.  VITAL SIGNS: Temperature 103, pulse 85, blood pressure 154/76, respiratory rate of 24, oxygen saturation is 93% on room air.  HEENT: Head normocephalic, atraumatic. There is no scleral icterus. Conjunctivae normal. Pupils equal and react to light. Extraocular movements are intact. Mucous membranes moist. No pharyngeal erythema.  NECK: Supple. No lymphadenopathy. No JVD. No carotid bruit.  CHEST: Has no focal tenderness.  LUNGS: Bilateral crackles in both lower lobes.  HEART: S1 and S2 regular, tachycardia.  ABDOMEN: Bowel sounds present. Soft, nontender, nondistended. No hepatosplenomegaly.  EXTREMITIES: No pedal edema. Pulses 2+.  NEUROLOGIC: The patient is oriented to place and person but not to time. No apparent cranial nerve abnormalities. Motor 5/5 in upper and lower extremities.   LABORATORIES: UA negative for nitrites and leukocyte esterase. Chest x-ray, 1 view portable: Chronic lung disease. Hyperexpansion. Superimposed left basilar pneumonia.   CBC: WBC of 15.6, hemoglobin 12, platelet count of 203.   CMP is completely within normal limits.   ASSESSMENT AND PLAN: Ms. Banwart is an 81 year old female who comes to the Emergency Department with pneumonia.  1. Bilateral pneumonia: Treat as a community-acquired pneumonia with Rocephin and Zithromax. The patient also received 1 dose of  vancomycin in the Emergency Department. Blood cultures and sputum cultures will be obtained.  2. Hypertension: Currently well controlled.  3. Debility: Will  involve physical therapy and occupational therapy.  4. Paroxysmal atrial fibrillation: Continue with the metoprolol.  5. Keep the patient on deep vein thrombosis prophylaxis with Lovenox.   TIME SPENT: 50 minutes.   ____________________________ Susa GriffinsPadmaja Annelie Boak, MD pv:gb D: 11/10/2013 23:34:51 ET T: 11/11/2013 02:03:13 ET JOB#: [id number]  cc: Susa GriffinsPadmaja Lashane Whelpley, MD, <Dictator> Danella PentonMark F. Miller, MD Clerance LavPADMAJA Katherin Ramey MD ELECTRONICALLY SIGNED 11/14/2013 23:21

## 2014-10-07 NOTE — Consult Note (Signed)
Brief Consult Note: Diagnosis: Nondisplaced fracture of right superior rami of pelvis.   Patient was seen by consultant.   Comments: Patient is a pleasant 81 y/o female who fell and injured her right pelvis.  She has experienced pain with ambulation.  xrays have demonstrated a non-displaced fracture involving the right pubis and extending into the right superior rami.  No surgical treatment will be needed.  Recommend PT evaluation for WBAT on right lower extremity.  Electronic Signatures: Juanell FairlyKrasinski, Chara Marquard (MD)  (Signed 14-Apr-15 21:32)  Authored: Brief Consult Note   Last Updated: 14-Apr-15 21:32 by Juanell FairlyKrasinski, Jennfer Gassen (MD)

## 2014-10-07 NOTE — Discharge Summary (Signed)
PATIENT NAME:  Joanna Park, Gaylon C MR#:  [id number] DATE OF BIRTH:  Dec 13, 1928  DATE OF ADMISSION:  09/26/2013 DATE OF DISCHARGE:   09/30/2013  DISCHARGE DIAGNOSES: 1.  Acute pelvic fracture.  2.  Dehydration.  3.  Hypertensive urgency.  4.  Coronary artery disease, post coronary artery bypass graft.  5.  Sick sinus syndrome, post pacemaker insertion.  6.  Hyperlipidemia.  7.  Osteoarthritis.  8.  Spinal stenosis.  9.  History of paroxysmal atrial fib.   DISCHARGE MEDICATIONS:  Heparin 5000 units subQ q.12, primidone 50 mg daily, mirtazapine 7.5 mg at bedtime, Norvasc 5 mg daily, metoprolol 25 mg q.a.m., galantamine 8 mg daily, vitamin B12, 1000 mcg daily; Vitamin D3, 1000 units daily; vitamin E 1000 units daily, tramadol 25 mg t.i.d. p.r.n. pain.   REASON FOR ADMISSION: An 81 year old female presents with severe pelvic pain and immobility. Please see H and P for HPI, past medical history and physical exam.   HOSPITAL COURSE: The patient was admitted, found to have a pelvic fracture per CT. She was hydrated. Her renal function improved. Her pain was controlled mostly with Tylenol with occasional tramadol. She cannot tolerate any narcotics without hallucinations. Her baseline home medicines were continued. She did have a fever blister and was treated with Valtrex. She will be getting OT and PT at skilled nursing. Overall prognosis is guarded.   ____________________________ Danella PentonMark F. Esten Dollar, MD mfm:dmm D: 09/30/2013 07:44:05 ET T: 09/30/2013 10:02:22 ET JOB#: [id number]  cc: Danella PentonMark F. Levar Fayson, MD, <Dictator> Danella PentonMARK F Mariadejesus Cade MD ELECTRONICALLY SIGNED 10/04/2013 8:09

## 2014-10-07 NOTE — H&P (Signed)
PATIENT NAME:  Joanna Park, Joanna Park MR#:  [id number] DATE OF BIRTH:  1929/02/10  DATE OF ADMISSION:  09/26/2013  REFERRING PHYSICIAN:  Dr. Shaune Pollack.  PRIMARY CARE PHYSICIAN:  Bethann Punches at [facility].   CHIEF COMPLAINT: Pelvic pain.   An 81 year old Caucasian female with history of coronary artery disease status post CABG, proximal atrial fibrillation, presenting with pelvic pain. She actually had a fall 1 day prior to arrival. At that time, she was at [facility], deemed to have no fracture and was subsequently discharged. However, today the pain worsened to the point where she was unable to ambulate. She describes pain mostly over the right anterior groin with radiation down to the leg to the knee. Pain is described as sharp, 10 out of 10 in intensity, worse with movements and standing; mild relief when lying down. She had a repeat CT today, which is now revealing of a  right pubic symphysis fracture. Currently, she is complaining only of pain, though rating it 3 to 4 out of 10, but she is unable to ambulate.   REVIEW OF SYSTEMS:  CONSTITUTIONAL: Denies fever, fatigue, weakness.  EYES: Denies blurred vision, double vision, eye pain.  EARS, NOSE, THROAT:  Denies ear pain or hearing loss.   RESPIRATORY: Denies cough, wheeze, shortness of breath.  CARDIOVASCULAR: Denies chest pain, palpitations or edema.  GASTROINTESTINAL: Denies nausea, vomiting, diarrhea or abdominal pain.  GENITOURINARY: Denies dysuria or hematuria.  ENDOCRINE: Denies nocturia or thyroid problems.  HEMATOLOGIC AND LYMPHATICS:  Denies easy bruising or bleeding.  SKIN: Denies rash or lesion.  MUSCULOSKELETAL: Denies any pain in neck, back, shoulder or knees. Positive for hip pain as described above. Denies any arthritic symptoms.  NEUROLOGIC: Denies paralysis or paresthesias.  PSYCHIATRIC: Denies anxiety or depressive symptoms. Otherwise, full review of systems performed by me is negative.   PAST MEDICAL HISTORY: Coronary  artery disease status post CABG, sick sinus syndrome status post pacemaker insertion, hyperlipidemia, osteoarthritis, degenerative joint disease, spinal stenosis and paroxysmal atrial fibrillation.   SOCIAL HISTORY: Denies any alcohol, tobacco or drug usage. She has a walker for ambulation, though only periodically uses it.   FAMILY HISTORY: Positive for coronary artery disease.   ALLERGIES: AVELOX, BETADINE, BIAXIN, CIPRO, CODEINE, DARVOCET, DEMEROL, ENTEX, IODINE AND IV CONTRAST DYE.   HOME MEDICATIONS: Include aspirin 81 mg p.o. daily, primidone 50 mg p.o. daily, mirtazapine 7.5 mg p.o. daily, metoprolol 50 mg 1/2 tablet p.o. daily, galantamine 8 mg p.o. daily, vitamin B12 of 1000 mcg p.o. daily, vitamin D3 of 1000 units p.o. daily, vitamin E 1000 International Units daily.   PHYSICAL EXAMINATION: VITAL SIGNS: Temperature 98.6, heart rate 64, respirations 18, blood pressure 108/92, saturating 92% on room air.  GENERAL: Well-nourished, well-developed, Caucasian female, currently in no acute distress.  HEAD: Normocephalic, atraumatic.  EYES: Pupils equal, round and reactive to light. Extraocular muscles intact. No scleral icterus.  MOUTH: Moist mucous membranes. Dentition is intact. No abscess noted.  EARS, NOSE, THROAT:  Clear without exudates. No external lesions.  NECK: Supple. No thyromegaly. No nodules.  No JVD.  PULMONARY: Clear to auscultation bilaterally without wheezes, rales or rhonchi. No use of accessory muscles. Good respiratory effort.  CHEST: Nontender to palpation.  CARDIOVASCULAR: S1, S2, regular rate and rhythm. No murmurs, rubs or gallops. No edema. Pedal pulses 2+ bilaterally.  GASTROINTESTINAL: Soft, nontender, nondistended. No masses. Positive bowel sounds. No hepatosplenomegaly.  MUSCULOSKELETAL: No swelling, clubbing or edema. Range of motion limited in right lower extremity secondary to pain. She,  however, has good distal strength in both lower extremities. Internal  and external rotation of right hip reproduces pain.  NEUROLOGIC: Cranial nerves II through XII intact. No gross focal neurological deficits. Reflexes intact. Sensation intact and distal strength in bilateral lower extremities 5/5 in both flexors and extensors. Proximal strength is limited secondary to pain.  SKIN: No ulcerations, lesions, rash or cyanosis. Skin warm, dry. Turgor intact.  PSYCHIATRIC: Mood and affect within normal limits. The patient is awake, alert, oriented x 3. Insight and judgment intact.   IMAGING AND LABORATORY DATA: Sodium 139, potassium 4.2, chloride 103, bicarb 30, BUN 16, creatinine 0.66, glucose of 66. WBC of 19, hemoglobin 12.4, platelets 212. Urinalysis negative for evidence of infection. CT performed revealing a nondisplaced fracture in the region of the right symphysis with components extending from the superior ramus through the symphyseal body into the inferior ramus. No other pelvic fractures noted. No soft tissue hematomas. CT of the lumbar spine revealing an old L3 endplate concavity due to an old fracture and multilevel disk bulges, which result in chronic narrowing.   ASSESSMENT AND PLAN:  An 81 year old Caucasian female with history of coronary artery disease status post coronary artery bypass graft, presented with pelvic pain.  1.  Nondisplaced fracture of the region of the right symphysis. Provide pain control, orthopedic evaluation and physical therapy evaluation, as well as check a vitamin D level.  2.  Coronary artery disease.  Continue aspirin and beta blocker therapy.  3.  Depression. Continue mirtazapine.  4. Venous thromboembolism prophylaxis with sequential compression devices.   The patient is FULL CODE.   TIME SPENT: 45 minutes.    ____________________________ Cletis Athensavid K. Keturah Yerby, MD dkh:dmm D: 09/26/2013 21:14:41 ET T: 09/26/2013 21:41:18 ET JOB#: [id number]  cc: Cletis Athensavid K. Lanore Renderos, MD, <Dictator> Nautia Lem Synetta ShadowK Chaunta Bejarano MD ELECTRONICALLY SIGNED 09/27/2013  2:49

## 2014-10-07 NOTE — Discharge Summary (Signed)
PATIENT NAME:  Joanna Park, Joanna Park MR#:  [id number] DATE OF BIRTH:  27-Jun-1928  DATE OF ADMISSION:  11/10/2013 DATE OF DISCHARGE: 11/16/2013   DISCHARGE DIAGNOSES:  1.  Acute basilar pneumonia.  2.  Hypertension.  3.  Hyperlipidemia.  4.  Moderate Alzheimer's dementia.  5.  Lumbar spinal stenosis.  6.  Moderate malnutrition.   DISCHARGE MEDICATIONS: Vitamin D3 1000 units daily; galantamine 8 mg q.a.m.; vitamin B12 1000 mcg daily; Tylenol 650 mg q.4 hours p.r.n.; tramadol 50 mg 1/2 tab q.6 hours p.r.n. pain; amlodipine 2.5 mg daily; aspirin 325 mg daily; mirtazapine 7.5 mg at bedtime; ranitidine 150 mg at bedtime; Augmentin 500 mg b.i.d. x 10 days; Probiotic b.i.d. x 10 days and Megace 40 mg q.a.m.   REASON FOR ADMISSION: An 81 year old female presents with basilar pneumonia. Please see H and P for HPI, past medical history and physical exam.   HOSPITAL COURSE: The patient was admitted, found to have a basilar pneumonia. Initially treated with vancomycin and Levaquin; however, she remained febrile and she was switched to Zosyn and azithromycin and her fever went away. Her oxygenation improved. She has not been eating well for a while after a pelvic fracture, which led to a [facility] stay. Megace was added and Ensure was added. Due to some arthralgias, she received Solu-Medrol low dose x 2, which did seem to help her quite a bit. She will be going back to skilled nursing for OT, PT. Overall prognosis is guarded, particularly with her decline and progressive dementia.  ____________________________ Danella PentonMark F. Miller, MD mfm:aw D: 11/16/2013 07:46:23 ET T: 11/16/2013 07:56:33 ET JOB#: [id number]  cc: Danella PentonMark F. Miller, MD, <Dictator> MARK Sherlene ShamsF MILLER MD ELECTRONICALLY SIGNED 11/18/2013 7:40

## 2014-10-07 NOTE — Consult Note (Signed)
PATIENT NAME:  Joanna Park, [name] MR#:  [id number] DATE OF BIRTH:  [birth year]  DATE OF CONSULTATION:  09/27/2013  REFERRING PHYSICIAN:  Dr. Clint GuyHower. CONSULTING PHYSICIAN:  Kathreen DevoidKevin L. Lanetta Figuero, MD  REASON FOR CONSULTATION: Right superior pubic ramus fracture.   HISTORY OF PRESENT ILLNESS: Ms. Joanna Park is an 81 year old female who explains that she fell onto her right side prior to admission. She had significant pain in the right hip and has been unable to ambulate following this injury. She denies any numbness or tingling in the lower extremities. She states that the right leg just gave out causing her to fall. She denies any presyncopal symptoms.   The patient's past medical history, past surgical history, family and social history, as well as medication allergies were reviewed from her admission H and P in the electronic medical record.   PHYSICAL EXAMINATION:  EXTREMITIES:  Right hip:  The patient has no shortening of the right lower extremity. There is no external rotation. She can flex and extend her toes, dorsiflex and plantarflex her ankle  and flex and extend her hip and knee actively with mild to moderate pain. She had minimal pain with  hip logrolling. The patient had tenderness over the pubis, but no tenderness over the anterior hip joint or laterally over the greater trochanter. She had intact sensation to light touch in both lower extremities with palpable pedal pulses.   RADIOLOGY: X-ray films of the right hip and pelvis, as well as a CT of the pelvis and CT of the lumbar spine were reviewed by me. These films demonstrate a nondisplaced fracture involving the right pubis extending into and superior rami, which is nondisplaced. The CT scan of the lumbar spine shows diffuse degenerative changes in the lumbar region with significant narrowing of the L5-S1 joint space. There was scalloping of the superior and inferior endplates of L3, which, according to the radiologist's assessment, is chronic. There  is no spondylolisthesis seen.   ASSESSMENT:  1.  Right pubis fracture extending into the superior rami.  2.  Degenerative disk disease of L5-S1.   PLAN:  Ms. Joanna Park is neurovascularly intact to the right lower extremity. She is having no neurologic deficit. Her symptoms, I believe, are coming from her fracture. I recommend symptomatic treatment for her pain. Continue physical therapy evaluation and treatment. The patient may require placement if she is having difficulty ambulating. No surgical intervention will be required for her pelvis injury. I am going to sign off for now, and can be reconsulted if there is any worsening in the patient's condition.    ____________________________ Kathreen DevoidKevin L. Para Cossey, MD klk:dmm D: 09/27/2013 21:38:00 ET T: 09/27/2013 22:23:40 ET JOB#: [id number]  cc: Kathreen DevoidKevin L. Avrohom Mckelvin, MD, <Dictator> Kathreen DevoidKEVIN L Shandora Koogler MD ELECTRONICALLY SIGNED 10/04/2013 15:31

## 2014-10-18 DIAGNOSIS — I110 Hypertensive heart disease with heart failure: Secondary | ICD-10-CM | POA: Diagnosis not present

## 2014-10-18 DIAGNOSIS — G309 Alzheimer's disease, unspecified: Secondary | ICD-10-CM | POA: Diagnosis not present

## 2014-10-18 DIAGNOSIS — I2510 Atherosclerotic heart disease of native coronary artery without angina pectoris: Secondary | ICD-10-CM | POA: Diagnosis not present

## 2014-10-18 DIAGNOSIS — I480 Paroxysmal atrial fibrillation: Secondary | ICD-10-CM | POA: Diagnosis not present

## 2014-10-18 DIAGNOSIS — K219 Gastro-esophageal reflux disease without esophagitis: Secondary | ICD-10-CM | POA: Diagnosis not present

## 2014-10-18 DIAGNOSIS — F0280 Dementia in other diseases classified elsewhere without behavioral disturbance: Secondary | ICD-10-CM | POA: Diagnosis not present

## 2014-10-18 DIAGNOSIS — E789 Disorder of lipoprotein metabolism, unspecified: Secondary | ICD-10-CM | POA: Diagnosis not present

## 2014-10-18 DIAGNOSIS — M150 Primary generalized (osteo)arthritis: Secondary | ICD-10-CM | POA: Diagnosis not present

## 2014-10-18 DIAGNOSIS — I509 Heart failure, unspecified: Secondary | ICD-10-CM | POA: Diagnosis not present

## 2014-11-21 DIAGNOSIS — G309 Alzheimer's disease, unspecified: Secondary | ICD-10-CM | POA: Diagnosis not present

## 2014-11-21 DIAGNOSIS — I2510 Atherosclerotic heart disease of native coronary artery without angina pectoris: Secondary | ICD-10-CM | POA: Diagnosis not present

## 2014-11-21 DIAGNOSIS — I480 Paroxysmal atrial fibrillation: Secondary | ICD-10-CM | POA: Diagnosis not present

## 2014-11-21 DIAGNOSIS — E789 Disorder of lipoprotein metabolism, unspecified: Secondary | ICD-10-CM | POA: Diagnosis not present

## 2014-11-21 DIAGNOSIS — I509 Heart failure, unspecified: Secondary | ICD-10-CM | POA: Diagnosis not present

## 2014-11-21 DIAGNOSIS — K219 Gastro-esophageal reflux disease without esophagitis: Secondary | ICD-10-CM | POA: Diagnosis not present

## 2014-11-21 DIAGNOSIS — I110 Hypertensive heart disease with heart failure: Secondary | ICD-10-CM | POA: Diagnosis not present

## 2014-11-21 DIAGNOSIS — M150 Primary generalized (osteo)arthritis: Secondary | ICD-10-CM | POA: Diagnosis not present

## 2014-11-21 DIAGNOSIS — F0280 Dementia in other diseases classified elsewhere without behavioral disturbance: Secondary | ICD-10-CM | POA: Diagnosis not present

## 2014-12-15 DIAGNOSIS — G309 Alzheimer's disease, unspecified: Secondary | ICD-10-CM | POA: Diagnosis not present

## 2014-12-15 DIAGNOSIS — I509 Heart failure, unspecified: Secondary | ICD-10-CM | POA: Diagnosis not present

## 2014-12-15 DIAGNOSIS — K219 Gastro-esophageal reflux disease without esophagitis: Secondary | ICD-10-CM | POA: Diagnosis not present

## 2014-12-15 DIAGNOSIS — F0280 Dementia in other diseases classified elsewhere without behavioral disturbance: Secondary | ICD-10-CM | POA: Diagnosis not present

## 2014-12-15 DIAGNOSIS — I110 Hypertensive heart disease with heart failure: Secondary | ICD-10-CM | POA: Diagnosis not present

## 2014-12-15 DIAGNOSIS — I2510 Atherosclerotic heart disease of native coronary artery without angina pectoris: Secondary | ICD-10-CM | POA: Diagnosis not present

## 2014-12-15 DIAGNOSIS — E789 Disorder of lipoprotein metabolism, unspecified: Secondary | ICD-10-CM | POA: Diagnosis not present

## 2014-12-15 DIAGNOSIS — M150 Primary generalized (osteo)arthritis: Secondary | ICD-10-CM | POA: Diagnosis not present

## 2014-12-15 DIAGNOSIS — I480 Paroxysmal atrial fibrillation: Secondary | ICD-10-CM | POA: Diagnosis not present

## 2015-01-16 DIAGNOSIS — I25118 Atherosclerotic heart disease of native coronary artery with other forms of angina pectoris: Secondary | ICD-10-CM | POA: Diagnosis not present

## 2015-01-16 DIAGNOSIS — I5022 Chronic systolic (congestive) heart failure: Secondary | ICD-10-CM | POA: Diagnosis not present

## 2015-01-16 DIAGNOSIS — I071 Rheumatic tricuspid insufficiency: Secondary | ICD-10-CM | POA: Diagnosis not present

## 2015-01-16 DIAGNOSIS — I495 Sick sinus syndrome: Secondary | ICD-10-CM | POA: Diagnosis not present

## 2015-01-16 DIAGNOSIS — I340 Nonrheumatic mitral (valve) insufficiency: Secondary | ICD-10-CM | POA: Diagnosis not present

## 2015-01-29 DIAGNOSIS — J069 Acute upper respiratory infection, unspecified: Secondary | ICD-10-CM | POA: Diagnosis not present

## 2015-02-09 DIAGNOSIS — I110 Hypertensive heart disease with heart failure: Secondary | ICD-10-CM | POA: Diagnosis not present

## 2015-02-09 DIAGNOSIS — E789 Disorder of lipoprotein metabolism, unspecified: Secondary | ICD-10-CM | POA: Diagnosis not present

## 2015-02-09 DIAGNOSIS — I480 Paroxysmal atrial fibrillation: Secondary | ICD-10-CM | POA: Diagnosis not present

## 2015-02-09 DIAGNOSIS — M150 Primary generalized (osteo)arthritis: Secondary | ICD-10-CM | POA: Diagnosis not present

## 2015-02-09 DIAGNOSIS — G309 Alzheimer's disease, unspecified: Secondary | ICD-10-CM | POA: Diagnosis not present

## 2015-02-09 DIAGNOSIS — I2510 Atherosclerotic heart disease of native coronary artery without angina pectoris: Secondary | ICD-10-CM | POA: Diagnosis not present

## 2015-02-09 DIAGNOSIS — F0280 Dementia in other diseases classified elsewhere without behavioral disturbance: Secondary | ICD-10-CM | POA: Diagnosis not present

## 2015-02-09 DIAGNOSIS — K219 Gastro-esophageal reflux disease without esophagitis: Secondary | ICD-10-CM | POA: Diagnosis not present

## 2015-02-09 DIAGNOSIS — I509 Heart failure, unspecified: Secondary | ICD-10-CM | POA: Diagnosis not present

## 2015-03-19 IMAGING — CR DG CHEST 2V
1 series · 2 of 2 positions shown · non-contrast
Comparison: 11/10/2013 and earlier.

CLINICAL DATA: 85-year-old female with fever cough. Initial
encounter.

EXAM:
CHEST  2 VIEW

[Series 11: x chest ap · 0.14mm/px · 2 of 2 slices shown]
[im 1/2]
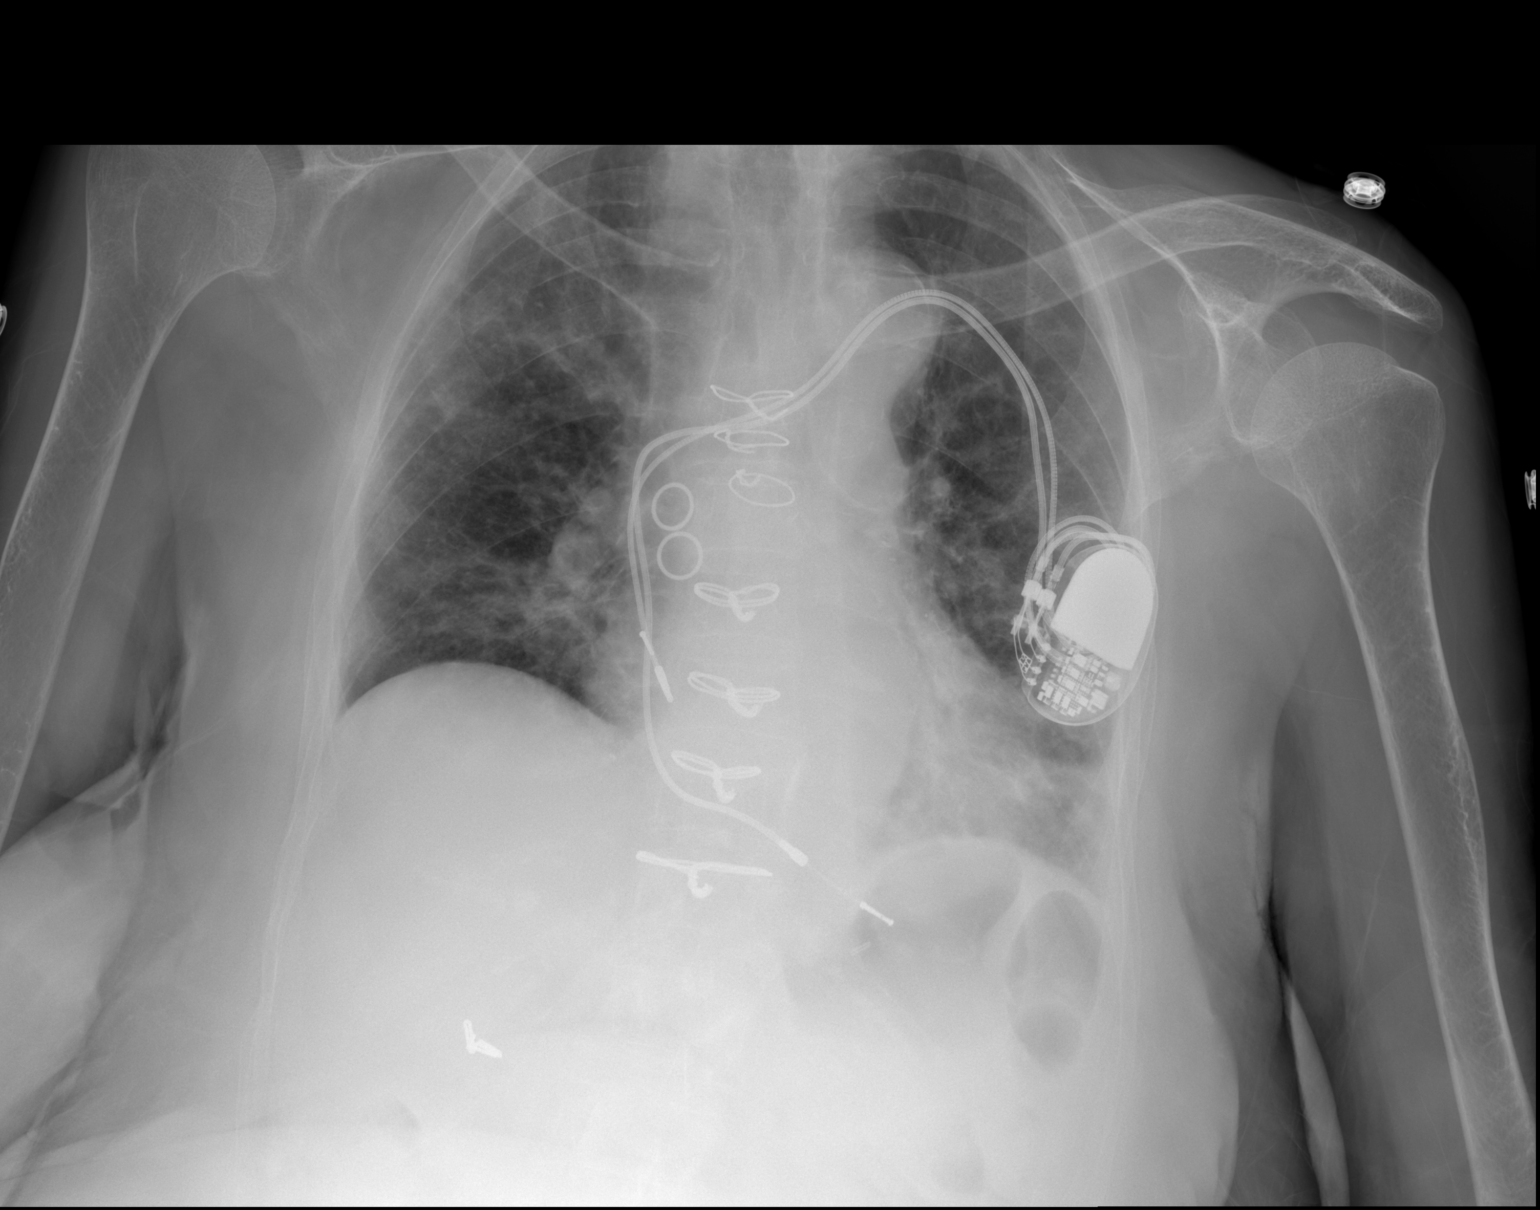
[im 2/2]
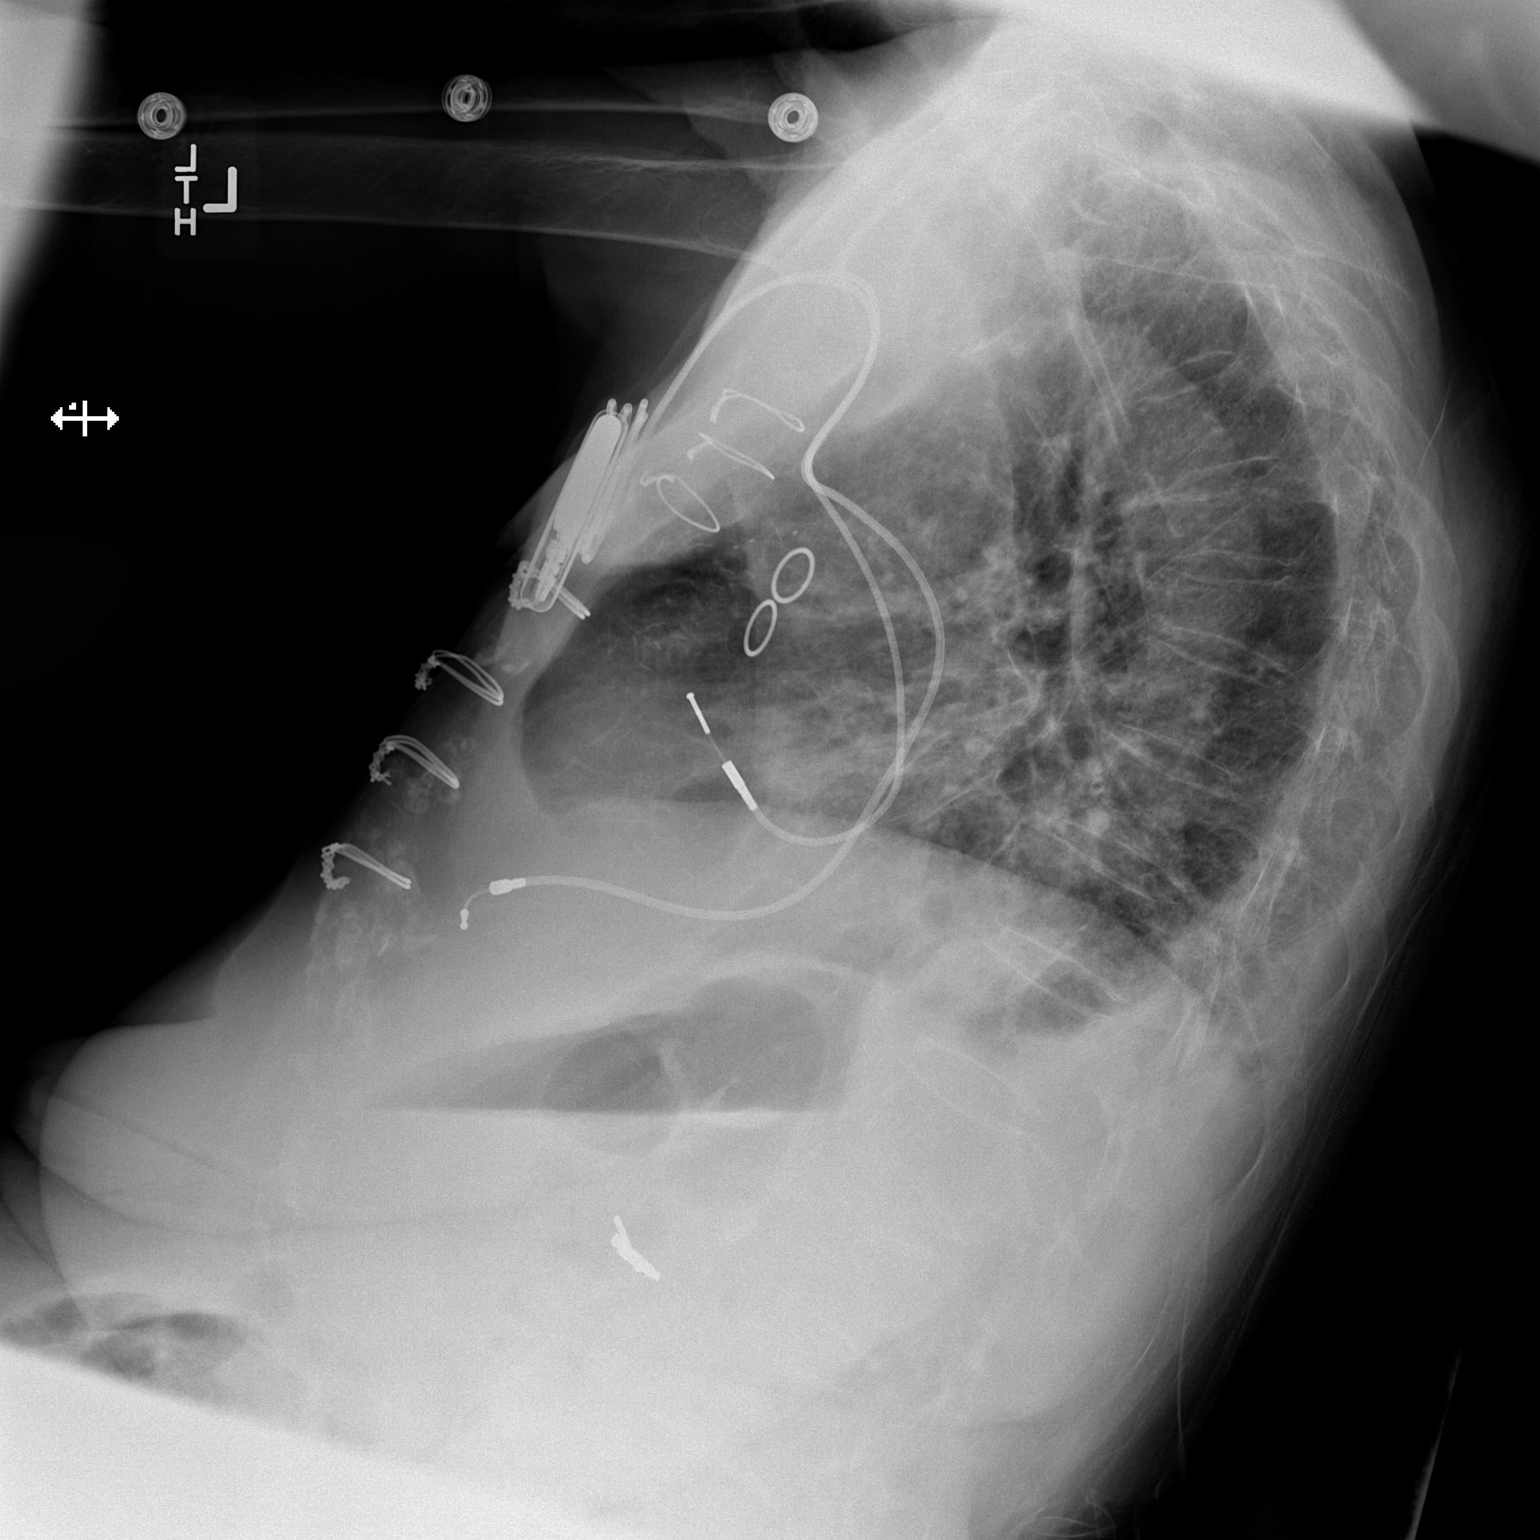

[2 of 2 positions shown; findings below may reference images not displayed]

FINDINGS: Upright AP and lateral views of the chest. Stable cardiac size and
mediastinal contours. Stable left chest cardiac pacemaker in
sequelae of CABG. Continued low lung volumes. No pneumothorax. No
pulmonary edema. Mildly increased perihilar streaky opacity
bilaterally. No consolidation. Osteopenia. Stable visualized osseous
structures.
IMPRESSION: 1. Stable low lung volumes.
2. Mildly increased streaky perihilar opacity, favor atelectasis but
viral/atypical respiratory infection difficult to exclude in the
setting of fever and cough.

## 2015-04-09 DIAGNOSIS — F0280 Dementia in other diseases classified elsewhere without behavioral disturbance: Secondary | ICD-10-CM | POA: Diagnosis not present

## 2015-05-01 DIAGNOSIS — R6889 Other general symptoms and signs: Secondary | ICD-10-CM | POA: Diagnosis not present

## 2015-05-08 DIAGNOSIS — R6889 Other general symptoms and signs: Secondary | ICD-10-CM | POA: Diagnosis not present

## 2015-05-17 DIAGNOSIS — G309 Alzheimer's disease, unspecified: Secondary | ICD-10-CM | POA: Diagnosis not present

## 2015-06-16 DIAGNOSIS — R6889 Other general symptoms and signs: Secondary | ICD-10-CM | POA: Diagnosis not present

## 2015-06-21 DIAGNOSIS — G309 Alzheimer's disease, unspecified: Secondary | ICD-10-CM | POA: Diagnosis not present

## 2015-06-21 DIAGNOSIS — F339 Major depressive disorder, recurrent, unspecified: Secondary | ICD-10-CM | POA: Diagnosis not present

## 2015-06-21 DIAGNOSIS — F0280 Dementia in other diseases classified elsewhere without behavioral disturbance: Secondary | ICD-10-CM | POA: Diagnosis not present

## 2015-06-23 DIAGNOSIS — I509 Heart failure, unspecified: Secondary | ICD-10-CM | POA: Diagnosis not present

## 2015-06-23 DIAGNOSIS — I2510 Atherosclerotic heart disease of native coronary artery without angina pectoris: Secondary | ICD-10-CM | POA: Diagnosis not present

## 2015-06-23 DIAGNOSIS — G309 Alzheimer's disease, unspecified: Secondary | ICD-10-CM | POA: Diagnosis not present

## 2015-06-23 DIAGNOSIS — I4891 Unspecified atrial fibrillation: Secondary | ICD-10-CM | POA: Diagnosis not present

## 2015-06-28 DIAGNOSIS — H2513 Age-related nuclear cataract, bilateral: Secondary | ICD-10-CM | POA: Diagnosis not present

## 2015-07-05 DIAGNOSIS — M6281 Muscle weakness (generalized): Secondary | ICD-10-CM | POA: Diagnosis not present

## 2015-07-05 DIAGNOSIS — R262 Difficulty in walking, not elsewhere classified: Secondary | ICD-10-CM | POA: Diagnosis not present

## 2015-07-06 DIAGNOSIS — R262 Difficulty in walking, not elsewhere classified: Secondary | ICD-10-CM | POA: Diagnosis not present

## 2015-07-06 DIAGNOSIS — M6281 Muscle weakness (generalized): Secondary | ICD-10-CM | POA: Diagnosis not present

## 2015-07-09 DIAGNOSIS — M6281 Muscle weakness (generalized): Secondary | ICD-10-CM | POA: Diagnosis not present

## 2015-07-09 DIAGNOSIS — R262 Difficulty in walking, not elsewhere classified: Secondary | ICD-10-CM | POA: Diagnosis not present

## 2015-07-10 DIAGNOSIS — M6281 Muscle weakness (generalized): Secondary | ICD-10-CM | POA: Diagnosis not present

## 2015-07-10 DIAGNOSIS — R262 Difficulty in walking, not elsewhere classified: Secondary | ICD-10-CM | POA: Diagnosis not present

## 2015-07-11 DIAGNOSIS — R262 Difficulty in walking, not elsewhere classified: Secondary | ICD-10-CM | POA: Diagnosis not present

## 2015-07-11 DIAGNOSIS — M6281 Muscle weakness (generalized): Secondary | ICD-10-CM | POA: Diagnosis not present

## 2015-07-12 DIAGNOSIS — M6281 Muscle weakness (generalized): Secondary | ICD-10-CM | POA: Diagnosis not present

## 2015-07-12 DIAGNOSIS — R262 Difficulty in walking, not elsewhere classified: Secondary | ICD-10-CM | POA: Diagnosis not present

## 2015-07-13 DIAGNOSIS — M6281 Muscle weakness (generalized): Secondary | ICD-10-CM | POA: Diagnosis not present

## 2015-07-13 DIAGNOSIS — R262 Difficulty in walking, not elsewhere classified: Secondary | ICD-10-CM | POA: Diagnosis not present

## 2015-07-16 DIAGNOSIS — M6281 Muscle weakness (generalized): Secondary | ICD-10-CM | POA: Diagnosis not present

## 2015-07-16 DIAGNOSIS — R262 Difficulty in walking, not elsewhere classified: Secondary | ICD-10-CM | POA: Diagnosis not present

## 2015-07-17 DIAGNOSIS — R262 Difficulty in walking, not elsewhere classified: Secondary | ICD-10-CM | POA: Diagnosis not present

## 2015-07-17 DIAGNOSIS — M6281 Muscle weakness (generalized): Secondary | ICD-10-CM | POA: Diagnosis not present

## 2015-07-18 DIAGNOSIS — M6281 Muscle weakness (generalized): Secondary | ICD-10-CM | POA: Diagnosis not present

## 2015-07-18 DIAGNOSIS — R262 Difficulty in walking, not elsewhere classified: Secondary | ICD-10-CM | POA: Diagnosis not present

## 2015-08-08 DIAGNOSIS — B351 Tinea unguium: Secondary | ICD-10-CM | POA: Diagnosis not present

## 2015-08-08 DIAGNOSIS — M79672 Pain in left foot: Secondary | ICD-10-CM | POA: Diagnosis not present

## 2015-08-08 DIAGNOSIS — M79671 Pain in right foot: Secondary | ICD-10-CM | POA: Diagnosis not present

## 2015-08-25 DIAGNOSIS — J811 Chronic pulmonary edema: Secondary | ICD-10-CM | POA: Diagnosis not present

## 2015-08-27 DIAGNOSIS — R509 Fever, unspecified: Secondary | ICD-10-CM | POA: Diagnosis not present

## 2015-09-13 DIAGNOSIS — F0280 Dementia in other diseases classified elsewhere without behavioral disturbance: Secondary | ICD-10-CM | POA: Diagnosis not present

## 2015-09-13 DIAGNOSIS — I495 Sick sinus syndrome: Secondary | ICD-10-CM | POA: Diagnosis not present

## 2015-09-13 DIAGNOSIS — F339 Major depressive disorder, recurrent, unspecified: Secondary | ICD-10-CM | POA: Diagnosis not present

## 2015-09-13 DIAGNOSIS — I480 Paroxysmal atrial fibrillation: Secondary | ICD-10-CM | POA: Diagnosis not present

## 2015-09-13 DIAGNOSIS — G309 Alzheimer's disease, unspecified: Secondary | ICD-10-CM | POA: Diagnosis not present

## 2015-09-13 DIAGNOSIS — I5022 Chronic systolic (congestive) heart failure: Secondary | ICD-10-CM | POA: Diagnosis not present

## 2015-09-13 DIAGNOSIS — I2510 Atherosclerotic heart disease of native coronary artery without angina pectoris: Secondary | ICD-10-CM | POA: Diagnosis not present

## 2015-09-17 DIAGNOSIS — R262 Difficulty in walking, not elsewhere classified: Secondary | ICD-10-CM | POA: Diagnosis not present

## 2015-09-17 DIAGNOSIS — M6281 Muscle weakness (generalized): Secondary | ICD-10-CM | POA: Diagnosis not present

## 2015-09-18 DIAGNOSIS — M6281 Muscle weakness (generalized): Secondary | ICD-10-CM | POA: Diagnosis not present

## 2015-09-18 DIAGNOSIS — R262 Difficulty in walking, not elsewhere classified: Secondary | ICD-10-CM | POA: Diagnosis not present

## 2015-09-19 DIAGNOSIS — R262 Difficulty in walking, not elsewhere classified: Secondary | ICD-10-CM | POA: Diagnosis not present

## 2015-09-19 DIAGNOSIS — M6281 Muscle weakness (generalized): Secondary | ICD-10-CM | POA: Diagnosis not present

## 2015-09-21 DIAGNOSIS — M6281 Muscle weakness (generalized): Secondary | ICD-10-CM | POA: Diagnosis not present

## 2015-09-21 DIAGNOSIS — R262 Difficulty in walking, not elsewhere classified: Secondary | ICD-10-CM | POA: Diagnosis not present

## 2015-09-23 DIAGNOSIS — M6281 Muscle weakness (generalized): Secondary | ICD-10-CM | POA: Diagnosis not present

## 2015-09-23 DIAGNOSIS — R262 Difficulty in walking, not elsewhere classified: Secondary | ICD-10-CM | POA: Diagnosis not present

## 2015-09-24 DIAGNOSIS — M6281 Muscle weakness (generalized): Secondary | ICD-10-CM | POA: Diagnosis not present

## 2015-09-24 DIAGNOSIS — R262 Difficulty in walking, not elsewhere classified: Secondary | ICD-10-CM | POA: Diagnosis not present

## 2015-09-25 DIAGNOSIS — M6281 Muscle weakness (generalized): Secondary | ICD-10-CM | POA: Diagnosis not present

## 2015-09-25 DIAGNOSIS — R262 Difficulty in walking, not elsewhere classified: Secondary | ICD-10-CM | POA: Diagnosis not present

## 2015-09-26 DIAGNOSIS — R262 Difficulty in walking, not elsewhere classified: Secondary | ICD-10-CM | POA: Diagnosis not present

## 2015-09-26 DIAGNOSIS — M6281 Muscle weakness (generalized): Secondary | ICD-10-CM | POA: Diagnosis not present

## 2015-11-03 DIAGNOSIS — R4182 Altered mental status, unspecified: Secondary | ICD-10-CM | POA: Diagnosis not present

## 2015-11-08 DIAGNOSIS — I509 Heart failure, unspecified: Secondary | ICD-10-CM | POA: Diagnosis not present

## 2015-11-08 DIAGNOSIS — G309 Alzheimer's disease, unspecified: Secondary | ICD-10-CM | POA: Diagnosis not present

## 2015-11-08 DIAGNOSIS — I4891 Unspecified atrial fibrillation: Secondary | ICD-10-CM | POA: Diagnosis not present

## 2015-11-08 DIAGNOSIS — F0280 Dementia in other diseases classified elsewhere without behavioral disturbance: Secondary | ICD-10-CM | POA: Diagnosis not present

## 2015-11-08 DIAGNOSIS — F339 Major depressive disorder, recurrent, unspecified: Secondary | ICD-10-CM | POA: Diagnosis not present

## 2015-11-08 DIAGNOSIS — F39 Unspecified mood [affective] disorder: Secondary | ICD-10-CM | POA: Diagnosis not present

## 2016-01-03 DIAGNOSIS — I509 Heart failure, unspecified: Secondary | ICD-10-CM | POA: Diagnosis not present

## 2016-01-03 DIAGNOSIS — G308 Other Alzheimer's disease: Secondary | ICD-10-CM | POA: Diagnosis not present

## 2016-01-03 DIAGNOSIS — I4891 Unspecified atrial fibrillation: Secondary | ICD-10-CM | POA: Diagnosis not present

## 2016-01-03 DIAGNOSIS — I2510 Atherosclerotic heart disease of native coronary artery without angina pectoris: Secondary | ICD-10-CM | POA: Diagnosis not present

## 2016-01-15 DIAGNOSIS — R109 Unspecified abdominal pain: Secondary | ICD-10-CM | POA: Diagnosis not present

## 2016-01-15 DIAGNOSIS — N390 Urinary tract infection, site not specified: Secondary | ICD-10-CM | POA: Diagnosis not present

## 2016-01-16 ENCOUNTER — Emergency Department: Payer: Commercial Managed Care - HMO

## 2016-01-16 ENCOUNTER — Emergency Department
Admission: EM | Admit: 2016-01-16 | Discharge: 2016-01-16 | Disposition: A | Discharge status: Home / Self Care | Payer: Commercial Managed Care - HMO | Attending: Student in an Organized Health Care Education/Training Program | Admitting: Student in an Organized Health Care Education/Training Program

## 2016-01-16 ENCOUNTER — Encounter: Payer: Self-pay | Admitting: Emergency Medicine

## 2016-01-16 DIAGNOSIS — G309 Alzheimer's disease, unspecified: Secondary | ICD-10-CM | POA: Insufficient documentation

## 2016-01-16 DIAGNOSIS — Z79899 Other long term (current) drug therapy: Secondary | ICD-10-CM | POA: Diagnosis not present

## 2016-01-16 DIAGNOSIS — I2510 Atherosclerotic heart disease of native coronary artery without angina pectoris: Secondary | ICD-10-CM | POA: Diagnosis not present

## 2016-01-16 DIAGNOSIS — M549 Dorsalgia, unspecified: Secondary | ICD-10-CM | POA: Diagnosis not present

## 2016-01-16 DIAGNOSIS — R079 Chest pain, unspecified: Secondary | ICD-10-CM | POA: Diagnosis not present

## 2016-01-16 DIAGNOSIS — Z7982 Long term (current) use of aspirin: Secondary | ICD-10-CM | POA: Diagnosis not present

## 2016-01-16 DIAGNOSIS — I509 Heart failure, unspecified: Secondary | ICD-10-CM | POA: Diagnosis not present

## 2016-01-16 DIAGNOSIS — F0390 Unspecified dementia without behavioral disturbance: Secondary | ICD-10-CM | POA: Diagnosis not present

## 2016-01-16 DIAGNOSIS — R0789 Other chest pain: Secondary | ICD-10-CM | POA: Diagnosis not present

## 2016-01-16 DIAGNOSIS — R072 Precordial pain: Secondary | ICD-10-CM | POA: Diagnosis not present

## 2016-01-16 DIAGNOSIS — R1013 Epigastric pain: Secondary | ICD-10-CM | POA: Diagnosis not present

## 2016-01-16 DIAGNOSIS — K5730 Diverticulosis of large intestine without perforation or abscess without bleeding: Secondary | ICD-10-CM | POA: Diagnosis not present

## 2016-01-16 DIAGNOSIS — Z743 Need for continuous supervision: Secondary | ICD-10-CM | POA: Diagnosis not present

## 2016-01-16 HISTORY — DX: Alzheimer's disease, unspecified: G30.9

## 2016-01-16 HISTORY — DX: Dementia in other diseases classified elsewhere, unspecified severity, without behavioral disturbance, psychotic disturbance, mood disturbance, and anxiety: F02.80

## 2016-01-16 HISTORY — DX: Heart failure, unspecified: I50.9

## 2016-01-16 LAB — CBC WITH DIFFERENTIAL/PLATELET
BASOS ABS: 0.1 10*3/uL (ref 0–0.1)
Basophils Relative: 1 %
EOS PCT: 5 %
Eosinophils Absolute: 0.3 10*3/uL (ref 0–0.7)
HCT: 39 % (ref 35.0–47.0)
HEMOGLOBIN: 12.7 g/dL (ref 12.0–16.0)
LYMPHS ABS: 2.2 10*3/uL (ref 1.0–3.6)
LYMPHS PCT: 32 %
MCH: 27.1 pg (ref 26.0–34.0)
MCHC: 32.7 g/dL (ref 32.0–36.0)
MCV: 83 fL (ref 80.0–100.0)
Monocytes Absolute: 0.7 10*3/uL (ref 0.2–0.9)
Monocytes Relative: 11 %
NEUTROS PCT: 51 %
Neutro Abs: 3.4 10*3/uL (ref 1.4–6.5)
PLATELETS: 147 10*3/uL — AB (ref 150–440)
RBC: 4.7 MIL/uL (ref 3.80–5.20)
RDW: 14.2 % (ref 11.5–14.5)
WBC: 6.7 10*3/uL (ref 3.6–11.0)

## 2016-01-16 LAB — URINALYSIS COMPLETE WITH MICROSCOPIC (ARMC ONLY)
Bacteria, UA: NONE SEEN
Bilirubin Urine: NEGATIVE
Glucose, UA: NEGATIVE mg/dL
Hgb urine dipstick: NEGATIVE
Ketones, ur: NEGATIVE mg/dL
Nitrite: NEGATIVE
PH: 5 (ref 5.0–8.0)
PROTEIN: NEGATIVE mg/dL
RBC / HPF: NONE SEEN RBC/hpf (ref 0–5)
Specific Gravity, Urine: 1.015 (ref 1.005–1.030)

## 2016-01-16 LAB — TROPONIN I: Troponin I: <0.03 ng/mL (ref <0.03)

## 2016-01-16 LAB — COMPREHENSIVE METABOLIC PANEL
ALK PHOS: 66 U/L (ref 38–126)
ALT: 11 U/L — AB (ref 14–54)
AST: 20 U/L (ref 15–41)
Albumin: 3.5 g/dL (ref 3.5–5.0)
Anion gap: 8 (ref 5–15)
BUN: 10 mg/dL (ref 6–20)
CALCIUM: 9 mg/dL (ref 8.9–10.3)
CHLORIDE: 104 mmol/L (ref 101–111)
CO2: 27 mmol/L (ref 22–32)
CREATININE: 0.84 mg/dL (ref 0.44–1.00)
GFR calc Af Amer: >60 mL/min (ref >60)
Glucose, Bld: 94 mg/dL (ref 65–99)
Potassium: 4.1 mmol/L (ref 3.5–5.1)
SODIUM: 139 mmol/L (ref 135–145)
Total Bilirubin: 0.3 mg/dL (ref 0.3–1.2)
Total Protein: 7.4 g/dL (ref 6.5–8.1)

## 2016-01-16 LAB — LACTIC ACID, PLASMA: LACTIC ACID, VENOUS: 1 mmol/L (ref 0.5–1.9)

## 2016-01-16 MED ORDER — ALUM & MAG HYDROXIDE-SIMETH 200-200-20 MG/5ML PO SUSP
15.0000 mL | Freq: Once | ORAL | Status: AC
  Administered 2016-01-16: 15 mL via ORAL
  Filled 2016-01-16: qty 30

## 2016-01-16 NOTE — ED Notes (Signed)
Attempted to call [facility] for discharge report x3. No answer.

## 2016-01-16 NOTE — ED Triage Notes (Signed)
Pt arrived via EMS from [facility] for new onset chest pain. MD at facility referred her here for evaluation. EMS reports VSS, high 90s SpO2 room air, paced rhythm. EMS administered 324 mg ASA.

## 2016-01-16 NOTE — ED Provider Notes (Addendum)
[facility] Emergency Department Provider Note    None    (approximate)  I have reviewed the triage vital signs and the nursing notes.   HISTORY  Chief Complaint Chest Pain    HPI Joanna Park is a 82 y.o. female who presents from nursing facility with history of CAD presents with acute onset chest pain patient has baseline dementia and therefore history is primarily obtained from nursing notes and EMS reports. They report the patient was sitting at the nursing desk at the facility this morning and began complaining of midsternal tight chest pain 10 in severity radiating to her neck. Arrival to the ER she denies any pain at this time because she states she feels short of breath.Has not had a recent fevers. She denies any abdominal pain.   Past Medical History:  Diagnosis Date  . Alzheimer's disease   . Appendicitis   . CAD (coronary artery disease)    a. s/p MI and CABG;  b. 04/2012 Cath: LM 70d, LAD 100ost, LCX50-70, OM1 small, subtotal occlusion, RCA 100p, VG->Diag patent, VG->dRCA patent, LIMA->LAD patent, EF40-45% diff HK w/ sev HK of basl inf wall and mod HK of periapical region.  . Cardiac pacemaker in situ   . Heart failure (HCC)   . Ischemic cardiomyopathy    a. 04/2012 Echo: EF 40-45%, mid-dist antersept DK, Gr 1 DD, Triv MR, Mild TR, PASP .    Patient Active Problem List   Diagnosis Date Noted  . Unstable angina (HCC) 04/18/2012  . CAD (coronary artery disease) of artery bypass graft 04/18/2012  . Hyperlipidemia 04/18/2012  . Cardiac pacemaker in situ 04/18/2012  . Hypertension 04/18/2012  . Essential tremor 04/18/2012  . Ischemic cardiomyopathy   . CAD (coronary artery disease)     Past Surgical History:  Procedure Laterality Date  . ABDOMINAL HYSTERECTOMY    . APPENDECTOMY    . CARDIAC CATHETERIZATION    . CORONARY ARTERY BYPASS GRAFT    . LEFT HEART CATHETERIZATION WITH CORONARY ANGIOGRAM N/A 04/17/2012   Procedure:  LEFT HEART CATHETERIZATION WITH CORONARY ANGIOGRAM;  Surgeon: Tonny Bollman, MD;  Location: [facility] CATH LAB;  Service: Cardiovascular;  Laterality: N/A;  . PERCUTANEOUS CORONARY STENT INTERVENTION (PCI-S) N/A 04/17/2012   Procedure: PERCUTANEOUS CORONARY STENT INTERVENTION (PCI-S);  Surgeon: Tonny Bollman, MD;  Location: [facility] CATH LAB;  Service: Cardiovascular;  Laterality: N/A;    Prior to Admission medications   Medication Sig Start Date End Date Taking? Authorizing Provider  acetaminophen (TYLENOL) 325 MG tablet Take 650 mg by mouth every 6 (six) hours as needed.   Yes Historical Provider, MD  aspirin 81 MG EC tablet Take 4 tablets (325 mg total) by mouth daily. 04/18/12  Yes Ok Anis, NP  cetirizine (ZYRTEC) 10 MG tablet Take 10 mg by mouth daily as needed for allergies.   Yes Historical Provider, MD  divalproex (DEPAKOTE) 125 MG DR tablet Take 1 tablet by mouth at bedtime.  12/22/15  Yes Historical Provider, MD  galantamine (RAZADYNE ER) 8 MG 24 hr capsule Take 1 capsule by mouth every evening.  01/01/16  Yes Historical Provider, MD  guaiFENesin-dextromethorphan (ROBITUSSIN DM) 100-10 MG/5ML syrup Take 10 mLs by mouth every 4 (four) hours as needed for cough.   Yes Historical Provider, MD  magnesium hydroxide (MILK OF MAGNESIA) 400 MG/5ML suspension Take 30 mLs by mouth daily as needed for mild constipation.   Yes Historical Provider, MD  metoprolol (LOPRESSOR) 50 MG tablet Take 25 mg by  mouth 2 (two) times daily.   Yes Historical Provider, MD  Multiple Vitamin (MULTIVITAMIN WITH MINERALS) TABS tablet Take 1 tablet by mouth daily.   Yes Historical Provider, MD  nitroGLYCERIN (NITROSTAT) 0.4 MG SL tablet Place 1 tablet (0.4 mg total) under the tongue every 5 (five) minutes x 3 doses as needed for chest pain. 04/18/12  Yes Ok Anis, NP  ranitidine (ZANTAC) 75 MG tablet Take 75 mg by mouth 2 (two) times daily.   Yes Historical Provider, MD  traMADol (ULTRAM) 50 MG tablet Take 25 mg by  mouth every 6 (six) hours as needed.  01/07/16   Historical Provider, MD    Allergies Avelox [moxifloxacin hcl in nacl]; Biaxin [clarithromycin]; Ciprofloxacin; Codeine; Darvocet [propoxyphene n-acetaminophen]; Entex lq [phenylephrine-guaifenesin]; Iodine; Ivp dye [iodinated diagnostic agents]; Meperidine and related; Morphine and related; Oxycodone hcl; Phenergan [promethazine hcl]; Prednisone; and Zocor [simvastatin]  No family history on file.  Social History Social History  Substance Use Topics  . Smoking status: Never Smoker  . Smokeless tobacco: Not on file  . Alcohol use No    Review of Systems Patient denies headaches, rhinorrhea, blurry vision, numbness, shortness of breath, chest pain, edema, cough, abdominal pain, nausea, vomiting, diarrhea, dysuria, fevers, rashes or hallucinations unless otherwise stated above in HPI. ____________________________________________   PHYSICAL EXAM:  VITAL SIGNS: ED Triage Vitals  Enc Vitals Group     BP      Pulse      Resp      Temp      Temp src      SpO2      Weight      Height      Head Circumference      Peak Flow      Pain Score      Pain Loc      Pain Edu?      Excl. in GC?     Constitutional: Elderly, Alert but disoriented at baseline Well appearing and in no acute distress. Eyes: Conjunctivae are normal. PERRL. EOMI. Head: Atraumatic. Nose: No congestion/rhinnorhea. Mouth/Throat: Mucous membranes are moist.  Oropharynx non-erythematous. Neck: No stridor. Painless ROM. No cervical spine tenderness to palpation Hematological/Lymphatic/Immunilogical: No cervical lymphadenopathy. Cardiovascular: Normal rate, regular rhythm. Grossly normal heart sounds.  Good peripheral circulation. Respiratory: Normal respiratory effort.  No retractions. Lungs CTAB. Gastrointestinal: mild epigastric ttp, No distention. No abdominal bruits. No CVA tenderness. Genitourinary:  Musculoskeletal: No lower extremity tenderness nor edema.   No joint effusions. Neurologic:  Normal speech and language. No gross focal neurologic deficits are appreciated. No gait instability. Skin:  Skin is warm, dry and intact. No rash noted.   ____________________________________________   LABS (all labs ordered are listed, but only abnormal results are displayed)  Labs Reviewed  CBC WITH DIFFERENTIAL/PLATELET - Abnormal; Notable for the following:       Result Value   Platelets 147 (*)    All other components within normal limits  COMPREHENSIVE METABOLIC PANEL - Abnormal; Notable for the following:    ALT 11 (*)    All other components within normal limits  URINALYSIS COMPLETEWITH MICROSCOPIC ([facility] ONLY) - Abnormal; Notable for the following:    Color, Urine YELLOW (*)    APPearance CLEAR (*)    Leukocytes, UA TRACE (*)    Squamous Epithelial / LPF 0-5 (*)    All other components within normal limits  TROPONIN I  LACTIC ACID, PLASMA  TROPONIN I   ____________________________________________  EKG My interpretation at Time:  7:55  Indication: chest pain  Rate: 70 Rhythm: Atrially paced rhythm,   Qtc 509, no sgarbossa criteria  ____________________________________________  RADIOLOGY  No acute intra-abdominl process.  CXR my read shows no evidence of acute cardiopulmonary process.  ____________________________________________   PROCEDURES  Procedure(s) performed: none    Critical Care performed: no ____________________________________________   INITIAL IMPRESSION / ASSESSMENT AND PLAN / ED COURSE  Pertinent labs & imaging results that were available during my care of the patient were reviewed by me and considered in my medical decision making (see chart for details).  DDX:  Acs, pericarditis, esophageal spasm, esophagitis, gastritis, pancreatitis, uti,   Pleasant elderly demented female presents with complaint of acute chest pain or nursing facility. Patient does have a history of CAD and due to her multiple  comorbidities patient was sent to the emergency department for further evaluation. Initial evaluation patient is afebrile hemodynamic stable. Labs, EKG and imaging ordered due to concern for chest pain given her Past medical history.  Clinical Course  Comment By Time  Patient reassessed and now having some discomfort in her epigastric area. Patient with negative laboratory evaluation this time and remains hemodynamically stable.  Based on tenderness of abdomen order CT abdomen further evaluation of obstructive process. Willy Eddy, MD 08/02 [phone]    ----------------------------------------- 1:27 PM on 01/16/2016 -----------------------------------------  Patient reassessed abdominal exam soft and benign. She currently denying any chest pain or discomfort. He did results of her CT imaging chest x-ray laboratory results with the patient and her family. She's had 2 EKGs and negative trips over 4 hours do not feel this likely consistent with ACS. Presentation not consistent with dissection or PE. No leukocytosis or acidosis. No evidence of acute intra-abdominal process. Symptoms did improve with by mouth Mylanta suggesting symptomatic of gastritis. She is no evidence of pneumonia or acute infectious process. The patient is otherwise at baseline has remained eminently stable in the emergency department feel patient is stable for discharge to nursing facility.  Have discussed with the patient and available family all diagnostics and treatments performed thus far and all questions were answered to the best of my ability. The patient demonstrates understanding and agreement with plan.   ____________________________________________   FINAL CLINICAL IMPRESSION(S) / ED DIAGNOSES  Final diagnoses:  Chest pain, unspecified chest pain type  Epigastric pain      NEW MEDICATIONS STARTED DURING THIS VISIT:  New Prescriptions   No medications on file     Note:  This document was prepared using  Dragon voice recognition software and may include unintentional dictation errors.    Willy Eddy, MD 01/16/16 1334    Willy Eddy, MD 01/27/16 [phone]

## 2016-01-16 NOTE — ED Notes (Signed)
Secretary notified of need of non emergency transportation back to [facility].

## 2016-01-16 NOTE — ED Notes (Addendum)
EMS here to transport patient to [facility].

## 2016-01-22 DIAGNOSIS — B351 Tinea unguium: Secondary | ICD-10-CM | POA: Diagnosis not present

## 2016-01-22 DIAGNOSIS — E119 Type 2 diabetes mellitus without complications: Secondary | ICD-10-CM | POA: Diagnosis not present

## 2016-01-22 DIAGNOSIS — M79671 Pain in right foot: Secondary | ICD-10-CM | POA: Diagnosis not present

## 2016-01-22 DIAGNOSIS — M79672 Pain in left foot: Secondary | ICD-10-CM | POA: Diagnosis not present

## 2016-01-24 DIAGNOSIS — G309 Alzheimer's disease, unspecified: Secondary | ICD-10-CM | POA: Diagnosis not present

## 2016-01-24 DIAGNOSIS — F0280 Dementia in other diseases classified elsewhere without behavioral disturbance: Secondary | ICD-10-CM | POA: Diagnosis not present

## 2016-01-24 DIAGNOSIS — F39 Unspecified mood [affective] disorder: Secondary | ICD-10-CM | POA: Diagnosis not present

## 2016-01-24 DIAGNOSIS — F339 Major depressive disorder, recurrent, unspecified: Secondary | ICD-10-CM | POA: Diagnosis not present

## 2016-02-13 DIAGNOSIS — R05 Cough: Secondary | ICD-10-CM | POA: Diagnosis not present

## 2016-03-10 DIAGNOSIS — R0981 Nasal congestion: Secondary | ICD-10-CM | POA: Diagnosis not present

## 2016-03-10 DIAGNOSIS — I10 Essential (primary) hypertension: Secondary | ICD-10-CM | POA: Diagnosis not present

## 2016-03-10 DIAGNOSIS — R079 Chest pain, unspecified: Secondary | ICD-10-CM | POA: Diagnosis not present

## 2016-04-07 DIAGNOSIS — Z23 Encounter for immunization: Secondary | ICD-10-CM | POA: Diagnosis not present

## 2016-04-23 DIAGNOSIS — G309 Alzheimer's disease, unspecified: Secondary | ICD-10-CM | POA: Diagnosis not present

## 2016-04-23 DIAGNOSIS — F39 Unspecified mood [affective] disorder: Secondary | ICD-10-CM | POA: Diagnosis not present

## 2016-04-23 DIAGNOSIS — F339 Major depressive disorder, recurrent, unspecified: Secondary | ICD-10-CM | POA: Diagnosis not present

## 2016-04-23 DIAGNOSIS — F0280 Dementia in other diseases classified elsewhere without behavioral disturbance: Secondary | ICD-10-CM | POA: Diagnosis not present

## 2016-05-07 DIAGNOSIS — I4891 Unspecified atrial fibrillation: Secondary | ICD-10-CM | POA: Diagnosis not present

## 2016-05-07 DIAGNOSIS — I2510 Atherosclerotic heart disease of native coronary artery without angina pectoris: Secondary | ICD-10-CM | POA: Diagnosis not present

## 2016-05-07 DIAGNOSIS — F0390 Unspecified dementia without behavioral disturbance: Secondary | ICD-10-CM | POA: Diagnosis not present

## 2016-05-14 DIAGNOSIS — I10 Essential (primary) hypertension: Secondary | ICD-10-CM | POA: Diagnosis not present

## 2016-06-20 DIAGNOSIS — B351 Tinea unguium: Secondary | ICD-10-CM | POA: Diagnosis not present

## 2016-06-20 DIAGNOSIS — I739 Peripheral vascular disease, unspecified: Secondary | ICD-10-CM | POA: Diagnosis not present

## 2016-06-20 DIAGNOSIS — I70203 Unspecified atherosclerosis of native arteries of extremities, bilateral legs: Secondary | ICD-10-CM | POA: Diagnosis not present

## 2016-06-20 DIAGNOSIS — R69 Illness, unspecified: Secondary | ICD-10-CM | POA: Diagnosis not present

## 2016-06-20 DIAGNOSIS — M79674 Pain in right toe(s): Secondary | ICD-10-CM | POA: Diagnosis not present

## 2016-07-08 DIAGNOSIS — F0280 Dementia in other diseases classified elsewhere without behavioral disturbance: Secondary | ICD-10-CM | POA: Diagnosis not present

## 2016-07-08 DIAGNOSIS — G308 Other Alzheimer's disease: Secondary | ICD-10-CM | POA: Diagnosis not present

## 2016-07-08 DIAGNOSIS — F39 Unspecified mood [affective] disorder: Secondary | ICD-10-CM | POA: Diagnosis not present

## 2016-07-08 DIAGNOSIS — F339 Major depressive disorder, recurrent, unspecified: Secondary | ICD-10-CM | POA: Diagnosis not present

## 2016-07-29 DIAGNOSIS — Z79899 Other long term (current) drug therapy: Secondary | ICD-10-CM | POA: Diagnosis not present

## 2016-07-29 DIAGNOSIS — F0280 Dementia in other diseases classified elsewhere without behavioral disturbance: Secondary | ICD-10-CM | POA: Diagnosis not present

## 2016-08-06 DIAGNOSIS — F0281 Dementia in other diseases classified elsewhere with behavioral disturbance: Secondary | ICD-10-CM | POA: Diagnosis not present

## 2016-08-06 DIAGNOSIS — I5022 Chronic systolic (congestive) heart failure: Secondary | ICD-10-CM | POA: Diagnosis not present

## 2016-08-06 DIAGNOSIS — I2510 Atherosclerotic heart disease of native coronary artery without angina pectoris: Secondary | ICD-10-CM | POA: Diagnosis not present

## 2016-08-06 DIAGNOSIS — G301 Alzheimer's disease with late onset: Secondary | ICD-10-CM | POA: Diagnosis not present

## 2016-08-06 DIAGNOSIS — I480 Paroxysmal atrial fibrillation: Secondary | ICD-10-CM | POA: Diagnosis not present

## 2016-09-02 DIAGNOSIS — G308 Other Alzheimer's disease: Secondary | ICD-10-CM | POA: Diagnosis not present

## 2016-09-02 DIAGNOSIS — F0280 Dementia in other diseases classified elsewhere without behavioral disturbance: Secondary | ICD-10-CM | POA: Diagnosis not present

## 2016-09-02 DIAGNOSIS — F39 Unspecified mood [affective] disorder: Secondary | ICD-10-CM | POA: Diagnosis not present

## 2016-09-02 DIAGNOSIS — F339 Major depressive disorder, recurrent, unspecified: Secondary | ICD-10-CM | POA: Diagnosis not present

## 2017-04-13 ENCOUNTER — Encounter: Payer: Self-pay | Admitting: *Deleted

## 2017-04-13 ENCOUNTER — Emergency Department: Payer: Medicare (Managed Care)

## 2017-04-13 ENCOUNTER — Inpatient Hospital Stay
Admission: EM | Admit: 2017-04-13 | Discharge: 2017-04-16 | DRG: 281 | Disposition: A | Discharge status: Hospice | Payer: Medicare (Managed Care) | Attending: Internal Medicine | Admitting: Internal Medicine

## 2017-04-13 DIAGNOSIS — F0390 Unspecified dementia without behavioral disturbance: Secondary | ICD-10-CM

## 2017-04-13 DIAGNOSIS — Z79891 Long term (current) use of opiate analgesic: Secondary | ICD-10-CM

## 2017-04-13 DIAGNOSIS — F0280 Dementia in other diseases classified elsewhere without behavioral disturbance: Secondary | ICD-10-CM | POA: Diagnosis present

## 2017-04-13 DIAGNOSIS — Z888 Allergy status to other drugs, medicaments and biological substances status: Secondary | ICD-10-CM

## 2017-04-13 DIAGNOSIS — I2510 Atherosclerotic heart disease of native coronary artery without angina pectoris: Secondary | ICD-10-CM | POA: Diagnosis present

## 2017-04-13 DIAGNOSIS — G309 Alzheimer's disease, unspecified: Secondary | ICD-10-CM | POA: Diagnosis present

## 2017-04-13 DIAGNOSIS — Z9109 Other allergy status, other than to drugs and biological substances: Secondary | ICD-10-CM

## 2017-04-13 DIAGNOSIS — K219 Gastro-esophageal reflux disease without esophagitis: Secondary | ICD-10-CM | POA: Diagnosis present

## 2017-04-13 DIAGNOSIS — Z66 Do not resuscitate: Secondary | ICD-10-CM | POA: Diagnosis present

## 2017-04-13 DIAGNOSIS — R778 Other specified abnormalities of plasma proteins: Secondary | ICD-10-CM

## 2017-04-13 DIAGNOSIS — E785 Hyperlipidemia, unspecified: Secondary | ICD-10-CM | POA: Diagnosis present

## 2017-04-13 DIAGNOSIS — Z951 Presence of aortocoronary bypass graft: Secondary | ICD-10-CM

## 2017-04-13 DIAGNOSIS — Z90710 Acquired absence of both cervix and uterus: Secondary | ICD-10-CM

## 2017-04-13 DIAGNOSIS — Z8249 Family history of ischemic heart disease and other diseases of the circulatory system: Secondary | ICD-10-CM

## 2017-04-13 DIAGNOSIS — Z885 Allergy status to narcotic agent status: Secondary | ICD-10-CM

## 2017-04-13 DIAGNOSIS — I214 Non-ST elevation (NSTEMI) myocardial infarction: Secondary | ICD-10-CM | POA: Diagnosis not present

## 2017-04-13 DIAGNOSIS — R079 Chest pain, unspecified: Secondary | ICD-10-CM

## 2017-04-13 DIAGNOSIS — Z91041 Radiographic dye allergy status: Secondary | ICD-10-CM

## 2017-04-13 DIAGNOSIS — R110 Nausea: Secondary | ICD-10-CM | POA: Diagnosis present

## 2017-04-13 DIAGNOSIS — Z955 Presence of coronary angioplasty implant and graft: Secondary | ICD-10-CM

## 2017-04-13 DIAGNOSIS — Z7982 Long term (current) use of aspirin: Secondary | ICD-10-CM

## 2017-04-13 DIAGNOSIS — J189 Pneumonia, unspecified organism: Secondary | ICD-10-CM

## 2017-04-13 DIAGNOSIS — I110 Hypertensive heart disease with heart failure: Secondary | ICD-10-CM | POA: Diagnosis present

## 2017-04-13 DIAGNOSIS — R9431 Abnormal electrocardiogram [ECG] [EKG]: Secondary | ICD-10-CM

## 2017-04-13 DIAGNOSIS — Z950 Presence of cardiac pacemaker: Secondary | ICD-10-CM

## 2017-04-13 DIAGNOSIS — Z79899 Other long term (current) drug therapy: Secondary | ICD-10-CM

## 2017-04-13 DIAGNOSIS — I255 Ischemic cardiomyopathy: Secondary | ICD-10-CM | POA: Diagnosis present

## 2017-04-13 DIAGNOSIS — Z515 Encounter for palliative care: Secondary | ICD-10-CM | POA: Diagnosis not present

## 2017-04-13 DIAGNOSIS — Z881 Allergy status to other antibiotic agents status: Secondary | ICD-10-CM

## 2017-04-13 DIAGNOSIS — R7989 Other specified abnormal findings of blood chemistry: Secondary | ICD-10-CM

## 2017-04-13 DIAGNOSIS — R627 Adult failure to thrive: Secondary | ICD-10-CM | POA: Diagnosis present

## 2017-04-13 DIAGNOSIS — R0602 Shortness of breath: Secondary | ICD-10-CM | POA: Diagnosis not present

## 2017-04-13 DIAGNOSIS — I252 Old myocardial infarction: Secondary | ICD-10-CM

## 2017-04-13 DIAGNOSIS — I5022 Chronic systolic (congestive) heart failure: Secondary | ICD-10-CM | POA: Diagnosis present

## 2017-04-13 NOTE — ED Triage Notes (Signed)
Report per EMS: Pt c/o mid chest pain radiating to back. Pt characterized pain as sharp. Pt stated during triage that she was short of breath, also. Pt became suddenly nauseated during transfer from stretcher, but did not vomit. [facility]/o CP an hour prior to calling EMS. Pt told EMS her chest pain started x 1 day ago. Pt is pale. Pt has hx of alzheimer's, sick sinus syndrome and has a pacer.

## 2017-04-14 ENCOUNTER — Encounter: Payer: Self-pay | Admitting: Internal Medicine

## 2017-04-14 ENCOUNTER — Inpatient Hospital Stay
Admit: 2017-04-14 | Discharge: 2017-04-14 | Disposition: A | Discharge status: Home / Self Care | Payer: Medicare (Managed Care) | Attending: Internal Medicine | Admitting: Internal Medicine

## 2017-04-14 DIAGNOSIS — E785 Hyperlipidemia, unspecified: Secondary | ICD-10-CM | POA: Diagnosis present

## 2017-04-14 DIAGNOSIS — Z885 Allergy status to narcotic agent status: Secondary | ICD-10-CM | POA: Diagnosis not present

## 2017-04-14 DIAGNOSIS — Z79891 Long term (current) use of opiate analgesic: Secondary | ICD-10-CM | POA: Diagnosis not present

## 2017-04-14 DIAGNOSIS — R627 Adult failure to thrive: Secondary | ICD-10-CM | POA: Diagnosis present

## 2017-04-14 DIAGNOSIS — Z951 Presence of aortocoronary bypass graft: Secondary | ICD-10-CM | POA: Diagnosis not present

## 2017-04-14 DIAGNOSIS — F0390 Unspecified dementia without behavioral disturbance: Secondary | ICD-10-CM | POA: Diagnosis not present

## 2017-04-14 DIAGNOSIS — I5022 Chronic systolic (congestive) heart failure: Secondary | ICD-10-CM | POA: Diagnosis present

## 2017-04-14 DIAGNOSIS — Z90710 Acquired absence of both cervix and uterus: Secondary | ICD-10-CM | POA: Diagnosis not present

## 2017-04-14 DIAGNOSIS — I214 Non-ST elevation (NSTEMI) myocardial infarction: Secondary | ICD-10-CM | POA: Diagnosis not present

## 2017-04-14 DIAGNOSIS — R110 Nausea: Secondary | ICD-10-CM | POA: Diagnosis present

## 2017-04-14 DIAGNOSIS — R0602 Shortness of breath: Secondary | ICD-10-CM | POA: Diagnosis present

## 2017-04-14 DIAGNOSIS — R079 Chest pain, unspecified: Secondary | ICD-10-CM | POA: Diagnosis present

## 2017-04-14 DIAGNOSIS — Z7982 Long term (current) use of aspirin: Secondary | ICD-10-CM | POA: Diagnosis not present

## 2017-04-14 DIAGNOSIS — Z515 Encounter for palliative care: Secondary | ICD-10-CM | POA: Diagnosis not present

## 2017-04-14 DIAGNOSIS — Z66 Do not resuscitate: Secondary | ICD-10-CM | POA: Diagnosis present

## 2017-04-14 DIAGNOSIS — I2510 Atherosclerotic heart disease of native coronary artery without angina pectoris: Secondary | ICD-10-CM | POA: Diagnosis present

## 2017-04-14 DIAGNOSIS — Z91041 Radiographic dye allergy status: Secondary | ICD-10-CM | POA: Diagnosis not present

## 2017-04-14 DIAGNOSIS — Z881 Allergy status to other antibiotic agents status: Secondary | ICD-10-CM | POA: Diagnosis not present

## 2017-04-14 DIAGNOSIS — F0280 Dementia in other diseases classified elsewhere without behavioral disturbance: Secondary | ICD-10-CM | POA: Diagnosis present

## 2017-04-14 DIAGNOSIS — K219 Gastro-esophageal reflux disease without esophagitis: Secondary | ICD-10-CM | POA: Diagnosis present

## 2017-04-14 DIAGNOSIS — I255 Ischemic cardiomyopathy: Secondary | ICD-10-CM | POA: Diagnosis present

## 2017-04-14 DIAGNOSIS — I110 Hypertensive heart disease with heart failure: Secondary | ICD-10-CM | POA: Diagnosis present

## 2017-04-14 DIAGNOSIS — G309 Alzheimer's disease, unspecified: Secondary | ICD-10-CM | POA: Diagnosis present

## 2017-04-14 DIAGNOSIS — Z955 Presence of coronary angioplasty implant and graft: Secondary | ICD-10-CM | POA: Diagnosis not present

## 2017-04-14 DIAGNOSIS — Z79899 Other long term (current) drug therapy: Secondary | ICD-10-CM | POA: Diagnosis not present

## 2017-04-14 DIAGNOSIS — I252 Old myocardial infarction: Secondary | ICD-10-CM | POA: Diagnosis not present

## 2017-04-14 DIAGNOSIS — Z950 Presence of cardiac pacemaker: Secondary | ICD-10-CM | POA: Diagnosis not present

## 2017-04-14 LAB — ECHOCARDIOGRAM COMPLETE
Height: 63 in
WEIGHTICAEL: 1944 [oz_av]

## 2017-04-14 LAB — COMPREHENSIVE METABOLIC PANEL
ALBUMIN: 3.7 g/dL (ref 3.5–5.0)
ALT: 11 U/L — AB (ref 14–54)
AST: 28 U/L (ref 15–41)
Alkaline Phosphatase: 79 U/L (ref 38–126)
Anion gap: 11 (ref 5–15)
BUN: 14 mg/dL (ref 6–20)
CHLORIDE: 99 mmol/L — AB (ref 101–111)
CO2: 28 mmol/L (ref 22–32)
CREATININE: 0.84 mg/dL (ref 0.44–1.00)
Calcium: 9.2 mg/dL (ref 8.9–10.3)
GFR calc Af Amer: >60 mL/min (ref >60)
GLUCOSE: 137 mg/dL — AB (ref 65–99)
POTASSIUM: 4.4 mmol/L (ref 3.5–5.1)
SODIUM: 138 mmol/L (ref 135–145)
Total Bilirubin: 0.6 mg/dL (ref 0.3–1.2)
Total Protein: 7.5 g/dL (ref 6.5–8.1)

## 2017-04-14 LAB — TROPONIN I
TROPONIN I: 25.31 ng/mL — AB (ref <0.03)
Troponin I: 0.04 ng/mL (ref <0.03)
Troponin I: 0.51 ng/mL (ref <0.03)

## 2017-04-14 LAB — PROTIME-INR
INR: 0.93
PROTHROMBIN TIME: 12.4 s (ref 11.4–15.2)

## 2017-04-14 LAB — CBC
HEMATOCRIT: 38.1 % (ref 35.0–47.0)
HEMOGLOBIN: 12.2 g/dL (ref 12.0–16.0)
MCH: 27.1 pg (ref 26.0–34.0)
MCHC: 32.1 g/dL (ref 32.0–36.0)
MCV: 84.3 fL (ref 80.0–100.0)
Platelets: 174 10*3/uL (ref 150–440)
RBC: 4.51 MIL/uL (ref 3.80–5.20)
RDW: 14.7 % — ABNORMAL HIGH (ref 11.5–14.5)
WBC: 10.8 10*3/uL (ref 3.6–11.0)

## 2017-04-14 LAB — HEPARIN LEVEL (UNFRACTIONATED)
HEPARIN UNFRACTIONATED: 0.95 [IU]/mL — AB (ref 0.30–0.70)
Heparin Unfractionated: 0.45 IU/mL (ref 0.30–0.70)

## 2017-04-14 LAB — LIPASE, BLOOD: Lipase: 33 U/L (ref 11–51)

## 2017-04-14 LAB — PROCALCITONIN: Procalcitonin: <0.1 ng/mL

## 2017-04-14 LAB — APTT: APTT: 29 s (ref 24–36)

## 2017-04-14 LAB — MRSA PCR SCREENING: MRSA by PCR: NEGATIVE

## 2017-04-14 LAB — MAGNESIUM: Magnesium: 2 mg/dL (ref 1.7–2.4)

## 2017-04-14 MED ORDER — ONDANSETRON HCL 4 MG PO TABS: 4.0000 mg | ORAL_TABLET | Freq: Four times a day (QID) | ORAL | Status: DC | PRN

## 2017-04-14 MED ORDER — DIVALPROEX SODIUM 125 MG PO DR TAB
125.0000 mg | DELAYED_RELEASE_TABLET | Freq: Every day | ORAL | Status: DC
  Administered 2017-04-14: 125 mg via ORAL
  Filled 2017-04-14 (×3): qty 1

## 2017-04-14 MED ORDER — HEPARIN (PORCINE) IN NACL 100-0.45 UNIT/ML-% IJ SOLN
650.0000 [IU]/h | INTRAMUSCULAR | Status: DC
  Administered 2017-04-14: 700 [IU]/h via INTRAVENOUS
  Filled 2017-04-14: qty 250

## 2017-04-14 MED ORDER — FLUTICASONE PROPIONATE 50 MCG/ACT NA SUSP
2.0000 | Freq: Every day | NASAL | Status: DC | PRN
  Filled 2017-04-14: qty 16

## 2017-04-14 MED ORDER — ASPIRIN 81 MG PO CHEW
324.0000 mg | CHEWABLE_TABLET | Freq: Once | ORAL | Status: AC
  Administered 2017-04-14: 324 mg via ORAL
  Filled 2017-04-14: qty 4

## 2017-04-14 MED ORDER — DEXTROSE 5 % IV SOLN
2.0000 g | Freq: Two times a day (BID) | INTRAVENOUS | Status: DC
  Filled 2017-04-14 (×2): qty 2

## 2017-04-14 MED ORDER — TRAMADOL HCL 50 MG PO TABS: 100.0000 mg | ORAL_TABLET | Freq: Once | ORAL | Status: DC

## 2017-04-14 MED ORDER — VANCOMYCIN HCL IN DEXTROSE 1-5 GM/200ML-% IV SOLN
1000.0000 mg | Freq: Once | INTRAVENOUS | Status: AC
  Administered 2017-04-14: 1000 mg via INTRAVENOUS
  Filled 2017-04-14: qty 200

## 2017-04-14 MED ORDER — TRAMADOL HCL 50 MG PO TABS: 50.0000 mg | ORAL_TABLET | Freq: Four times a day (QID) | ORAL | Status: DC | PRN

## 2017-04-14 MED ORDER — ONDANSETRON HCL 4 MG/2ML IJ SOLN
4.0000 mg | Freq: Four times a day (QID) | INTRAMUSCULAR | Status: DC | PRN
  Administered 2017-04-14 (×3): 4 mg via INTRAVENOUS
  Filled 2017-04-14 (×3): qty 2

## 2017-04-14 MED ORDER — BISACODYL 10 MG RE SUPP
10.0000 mg | Freq: Once | RECTAL | Status: AC
  Administered 2017-04-14: 10 mg via RECTAL
  Filled 2017-04-14: qty 1

## 2017-04-14 MED ORDER — ALBUTEROL SULFATE (2.5 MG/3ML) 0.083% IN NEBU: 2.5000 mg | INHALATION_SOLUTION | RESPIRATORY_TRACT | Status: DC | PRN

## 2017-04-14 MED ORDER — HEPARIN SODIUM (PORCINE) 5000 UNIT/ML IJ SOLN
60.0000 [IU]/kg | Freq: Once | INTRAMUSCULAR | Status: AC
  Administered 2017-04-14: 3550 [IU] via INTRAVENOUS
  Filled 2017-04-14: qty 1

## 2017-04-14 MED ORDER — MORPHINE SULFATE (PF) 2 MG/ML IV SOLN
0.5000 mg | INTRAVENOUS | Status: DC | PRN
  Administered 2017-04-14 – 2017-04-15 (×2): 0.5 mg via INTRAVENOUS
  Filled 2017-04-14 (×2): qty 1

## 2017-04-14 MED ORDER — ADULT MULTIVITAMIN LIQUID CH
15.0000 mL | Freq: Every day | ORAL | Status: DC
  Administered 2017-04-14: 15 mL via ORAL
  Filled 2017-04-14 (×2): qty 15

## 2017-04-14 MED ORDER — METOPROLOL TARTRATE 25 MG PO TABS
12.5000 mg | ORAL_TABLET | Freq: Two times a day (BID) | ORAL | Status: DC
  Administered 2017-04-14 (×3): 12.5 mg via ORAL
  Filled 2017-04-14 (×3): qty 1

## 2017-04-14 MED ORDER — MORPHINE SULFATE (PF) 2 MG/ML IV SOLN
2.0000 mg | Freq: Once | INTRAVENOUS | Status: AC
  Administered 2017-04-14: 2 mg via INTRAVENOUS
  Filled 2017-04-14: qty 1

## 2017-04-14 MED ORDER — POLYETHYLENE GLYCOL 3350 17 G PO PACK: 17.0000 g | PACK | Freq: Every day | ORAL | Status: DC | PRN

## 2017-04-14 MED ORDER — GALANTAMINE HYDROBROMIDE ER 8 MG PO CP24
8.0000 mg | ORAL_CAPSULE | Freq: Every day | ORAL | Status: DC
Start: 2017-04-14 — End: 2017-04-16
  Administered 2017-04-14: 8 mg via ORAL
  Filled 2017-04-14 (×3): qty 1

## 2017-04-14 MED ORDER — ENOXAPARIN SODIUM 40 MG/0.4ML ~~LOC~~ SOLN: 40.0000 mg | SUBCUTANEOUS | Status: DC

## 2017-04-14 MED ORDER — FAMOTIDINE 20 MG PO TABS
20.0000 mg | ORAL_TABLET | Freq: Two times a day (BID) | ORAL | Status: DC
  Administered 2017-04-14 (×3): 20 mg via ORAL
  Filled 2017-04-14 (×4): qty 1

## 2017-04-14 MED ORDER — HEPARIN BOLUS VIA INFUSION
3500.0000 [IU] | Freq: Once | INTRAVENOUS | Status: AC
  Administered 2017-04-14: 3500 [IU] via INTRAVENOUS
  Filled 2017-04-14: qty 3500

## 2017-04-14 MED ORDER — GUAIFENESIN-DM 100-10 MG/5ML PO SYRP: 10.0000 mL | ORAL_SOLUTION | ORAL | Status: DC | PRN

## 2017-04-14 MED ORDER — POLYETHYLENE GLYCOL 3350 17 G PO PACK: 17.0000 g | PACK | Freq: Two times a day (BID) | ORAL | Status: DC | PRN

## 2017-04-14 MED ORDER — DEXTROSE 5 % IV SOLN
2.0000 g | Freq: Once | INTRAVENOUS | Status: AC
  Administered 2017-04-14: 2 g via INTRAVENOUS
  Filled 2017-04-14: qty 2

## 2017-04-14 MED ORDER — ENSURE ENLIVE PO LIQD
237.0000 mL | Freq: Two times a day (BID) | ORAL | Status: DC
  Administered 2017-04-14 – 2017-04-16 (×2): 237 mL via ORAL

## 2017-04-14 MED ORDER — ASPIRIN EC 81 MG PO TBEC
81.0000 mg | DELAYED_RELEASE_TABLET | Freq: Every day | ORAL | Status: DC
  Administered 2017-04-14: 81 mg via ORAL
  Filled 2017-04-14 (×2): qty 1

## 2017-04-14 MED ORDER — SODIUM CHLORIDE 0.9 % IV BOLUS (SEPSIS)
250.0000 mL | INTRAVENOUS | Status: AC
  Administered 2017-04-14: 250 mL via INTRAVENOUS

## 2017-04-14 MED ORDER — ACETAMINOPHEN 650 MG RE SUPP
650.0000 mg | Freq: Four times a day (QID) | RECTAL | Status: DC | PRN
Start: 2017-04-14 — End: 2017-04-16

## 2017-04-14 MED ORDER — ACETAMINOPHEN 325 MG PO TABS: 650.0000 mg | ORAL_TABLET | Freq: Four times a day (QID) | ORAL | Status: DC | PRN

## 2017-04-14 NOTE — Progress Notes (Signed)
Dr Allena KatzPatel was made aware of pt's trop. Level od 25.31, no new order will continue to monitor

## 2017-04-14 NOTE — Progress Notes (Signed)
ANTICOAGULATION CONSULT NOTE - Initial Consult  Pharmacy Consult for heparin drip Indication: chest pain/ACS/NSTEMI  Allergies  Allergen Reactions  . Avelox [Moxifloxacin Hcl In Nacl] Hives  . Biaxin [Clarithromycin] Hives  . Ciprofloxacin Hives  . Codeine     hallucinations  . Darvocet [Propoxyphene N-Acetaminophen] Hives  . Entex Lq [Phenylephrine-Guaifenesin] Hives  . Iodine Hives  . Ivp Dye [Iodinated Diagnostic Agents] Hives  . Meperidine And Related Other (See Comments)    hallucinations  . Morphine And Related     hallucinations  . Oxycodone Hcl Other (See Comments)    hallucinations  . Phenergan [Promethazine Hcl] Other (See Comments)    hallucinations  . Prednisone Other (See Comments)    Hallucinations  . Vancomycin Hives and Itching  . Zocor [Simvastatin]     Patient Measurements: Height: 5\' 3"  (160 cm) Weight: 130 lb (59 kg) IBW/kg (Calculated) : 52.4 Heparin Dosing Weight: 59 kg  Vital Signs: Temp: 97.8 F (36.6 C) (10/29 2312) Temp Source: Rectal (10/29 2312) BP: 144/82 (10/30 0300) Pulse Rate: 62 (10/30 0300)  Labs:  Recent Labs  04/13/17 2342 04/14/17 0029 04/14/17 0210 04/14/17 0242  HGB  --  12.2  --   --   HCT  --  38.1  --   --   PLT  --  174  --   --   APTT  --   --   --  29  LABPROT 12.4  --   --   --   INR 0.93  --   --   --   CREATININE 0.84  --   --   --   TROPONINI 0.04*  --  0.51*  --     Estimated Creatinine Clearance: 39.8 mL/min (by C-G formula based on SCr of 0.84 mg/dL).   Medical History: Past Medical History:  Diagnosis Date  . Alzheimer'Park disease   . Appendicitis   . CAD (coronary artery disease)    a. Park/p MI and CABG;  b. 04/2012 Cath: LM 70d, LAD 100ost, LCX50-70, OM1 small, subtotal occlusion, RCA 100p, VG->Diag patent, VG->dRCA patent, LIMA->LAD patent, EF40-45% diff HK w/ sev HK of basl inf wall and mod HK of periapical region.  . Cardiac pacemaker in situ   . Heart failure (HCC)   . Ischemic  cardiomyopathy    a. 04/2012 Echo: EF 40-45%, mid-dist antersept DK, Gr 1 DD, Triv MR, Mild TR, PASP 35mmHg.    Medications:  No anticoagulation in PTA meds  Assessment:  Goal of Therapy:  Heparin level 0.3-0.7 units/ml Monitor platelets by anticoagulation protocol: Yes   Plan:  3500 unit bolus and initial rate of 700 units/hr. First heparin level 8 hours after start of infusion.  Joanna Park 04/14/2017,3:24 AM

## 2017-04-14 NOTE — Progress Notes (Signed)
Pt c/o nausea and vomiting. Had one vomiting episode, last BM is unknown, pt is also c/o epigastric pain during vomiting episode. MD made aware, Will administer a dulcolax suppository as ordered by MD.

## 2017-04-14 NOTE — Progress Notes (Addendum)
[facility] [place] [facility]   PATIENT NAME: Gean Birchwoodatty Bayley    MR#:  [id number]  DATE OF BIRTH:  07/13/1928  SUBJECTIVE:  Came in with chest pain and nausea.  Patient is a long-term resident at [facility].  Family in the room.  REVIEW OF SYSTEMS:   Review of Systems  Constitutional: Negative for chills, fever and weight loss.  HENT: Negative for ear discharge, ear pain and nosebleeds.   Eyes: Negative for blurred vision, pain and discharge.  Respiratory: Negative for sputum production, shortness of breath, wheezing and stridor.   Cardiovascular: Positive for chest pain. Negative for palpitations, orthopnea and PND.  Gastrointestinal: Positive for nausea. Negative for abdominal pain, diarrhea and vomiting.  Genitourinary: Negative for frequency and urgency.  Musculoskeletal: Negative for back pain and joint pain.  Neurological: Negative for sensory change, speech change, focal weakness and weakness.  Psychiatric/Behavioral: Negative for depression and hallucinations. The patient is not nervous/anxious.    Tolerating Diet:some Tolerating PT: does not walk  DRUG ALLERGIES:   Allergies  Allergen Reactions  . Avelox [Moxifloxacin Hcl In Nacl] Hives  . Biaxin [Clarithromycin] Hives  . Ciprofloxacin Hives  . Codeine     hallucinations  . Darvocet [Propoxyphene N-Acetaminophen] Hives  . Entex Lq [Phenylephrine-Guaifenesin] Hives  . Iodine Hives  . Ivp Dye [Iodinated Diagnostic Agents] Hives  . Meperidine And Related Other (See Comments)    hallucinations  . Morphine And Related     hallucinations  . Oxycodone Hcl Other (See Comments)    hallucinations  . Phenergan [Promethazine Hcl] Other (See Comments)    hallucinations  . Prednisone Other (See Comments)    Hallucinations  . Vancomycin Hives and Itching  . Zocor [Simvastatin]     VITALS:  Blood pressure 125/60, pulse 61, temperature 98.4 F (36.9 C), temperature source Oral, resp.  rate 18, height 5\' 3"  (1.6 m), weight 55.1 kg (121 lb 8 oz), SpO2 93 %.  PHYSICAL EXAMINATION:   Physical Exam  GENERAL:  83 y.o.-year-old patient lying in the bed with no acute distress. thin EYES: Pupils equal, round, reactive to light and accommodation. No scleral icterus. Extraocular muscles intact.  HEENT: Head atraumatic, normocephalic. Oropharynx and nasopharynx clear.  NECK:  Supple, no jugular venous distention. No thyroid enlargement, no tenderness.  LUNGS: Normal breath sounds bilaterally, no wheezing, rales, rhonchi. No use of accessory muscles of respiration.  CARDIOVASCULAR: S1, S2 normal. No murmurs, rubs, or gallops.  ABDOMEN: Soft, nontender, nondistended. Bowel sounds present. No organomegaly or mass.  EXTREMITIES: No cyanosis, clubbing or edema b/l.    NEUROLOGIC: Cranial nerves II through XII are intact. No focal Motor or sensory deficits b/l.   PSYCHIATRIC:  patient is alert and oriented x 3.  SKIN: No obvious rash, lesion, or ulcer.   LABORATORY PANEL:  CBC  Recent Labs Lab 04/14/17 0029  WBC 10.8  HGB 12.2  HCT 38.1  PLT 174    Chemistries   Recent Labs Lab 04/13/17 2342  NA 138  K 4.4  CL 99*  CO2 28  GLUCOSE 137*  BUN 14  CREATININE 0.84  CALCIUM 9.2  MG 2.0  AST 28  ALT 11*  ALKPHOS 79  BILITOT 0.6   Cardiac Enzymes  Recent Labs Lab 04/14/17 0727  TROPONINI 25.31*   RADIOLOGY:  Dg Chest Port 1 View  Result Date: 04/14/2017 CLINICAL DATA:  83 year old female with shortness of breath. EXAM: PORTABLE CHEST 1 VIEW COMPARISON:  Chest radiograph dated 01/16/2016  FINDINGS: Mild diffuse chronic interstitial coarsening primarily at the lung bases. An area of increased density at the left lung base likely atelectatic changes/scarring. Developing infiltrate is not excluded. Clinical correlation is recommended. There is no pleural effusion or pneumothorax. There is mild cardiomegaly. There is atherosclerotic calcification of the aortic arch.  Median sternotomy wires and CABG vascular clips noted. Left pectoral pacemaker device. Osteopenia with degenerative changes of the spine. No acute osseous pathology. IMPRESSION: Left lung base airspace density may represent atelectatic changes or scarring. Developing infiltrate is not excluded. Clinical correlation is recommended. PA and lateral views of the chest may provide better evaluation. Mild cardiomegaly. Electronically Signed   By: Elgie Collard M.D.   On: 04/14/2017 00:15   ASSESSMENT AND PLAN:   Tajana Crotteau  is a 83 y.o. female with a known history of CAD, Dementia, HTN here with chest pain. Patient sent from local nursing home due to complaints of chest pain.  Patient is poor historian with her dementia.  Presently is chest pain-free.  Troponin normal.  EKG shows paced rhythm with concern for acute MI.  Cardiology was called and not thought to be ST elevation MI.  *acute  NSTEMI Repeated a STAT troponin in 2 hrs from initial and elevated to 0.51--25.6 -Continue IV heparin drip.  -ASA and Plavix along with beta-blockers  -Unable to give statins due to allergy -Cardiology consult with [facility].  *Nausea As needed no Zofran  * Chronic systolic chf Stable -Is not in heart failure  *GERD continue PPI  Discussed with patient's son and daughter in the room  Case discussed with Care Management/Social Worker. Management plans discussed with the patient, family and they are in agreement.  CODE STATUS: full code  DVT Prophylaxis: Heparin gtt  TOTAL TIME TAKING CARE OF THIS PATIENT: *30* minutes.  >50% time spent on counselling and coordination of care  POSSIBLE D/C IN *1-2 DAYS, DEPENDING ON CLINICAL CONDITION.  Note: This dictation was prepared with Dragon dictation along with smaller phrase technology. Any transcriptional errors that result from this process are unintentional.  Ramaya Guile M.D on 04/14/2017 at 3:39 PM  Between 7am to 6pm - Pager -  [phone]  After 6pm go to [email or web address] - password [email or web address]  [facility]  Office  [phone]  CC: Primary care physician; System, Pcp Not In

## 2017-04-14 NOTE — Consult Note (Signed)
[facility] Cardiology Consultation Note  Patient ID: Joanna Park, MRN: [id number], DOB/AGE: 1929/02/19 83 y.o. Admit date: 04/13/2017   Date of Consult: 04/14/2017 Primary Physician: System, Pcp Not In Primary Cardiologist: Gwen Pounds  Chief Complaint:  Chief Complaint  Patient presents with  . Chest Pain   Reason for Consult: myocardial infarction  HPI: 83 y.o. female with known coronary artery disease status post previous myocardial infarction and coronary artery bypass graft in the past for which the myocardial infarction appeared to be an inferior myocardial infarction with inferior basal wall and apical dysfunction. The patient did have some cardiomyopathy with aggressive with an ejection fraction of 40%. The patient also had heart block for which she was on the pacemaker and has been working fairly well. She has had significant dementia for which she was placed in a nursing home and was given metoprolol only. She was not using high intensity cholesterol therapy and there is no reports of aspirin use. At that time the patient had severe substernal chest discomfort with nausea and abdominal discomfort for which showed the patient then was taken to the emergency room. EKG showed the AV pacing and therefore there was no evidence of ST elevation myocardial infarction and the patient was medically managed. Heparin was started and the patient still has some lower chest and upper abdominal discomfort with some nausea consistent with continued myocardial infarction. The patient has had a troponin elevation of 25. With long discussion with the family and her previous wishes to be DO NOT RESUSCITATE the patient and family at that time wished medication management only. We have discussed at length about medication management which will be listed below  Past Medical History:  Diagnosis Date  . Alzheimer's disease   . Appendicitis   . CAD (coronary artery disease)    a. s/p MI and CABG;  b.  04/2012 Cath: LM 70d, LAD 100ost, LCX50-70, OM1 small, subtotal occlusion, RCA 100p, VG->Diag patent, VG->dRCA patent, LIMA->LAD patent, EF40-45% diff HK w/ sev HK of basl inf wall and mod HK of periapical region.  . Cardiac pacemaker in situ   . Heart failure (HCC)   . Ischemic cardiomyopathy    a. 04/2012 Echo: EF 40-45%, mid-dist antersept DK, Gr 1 DD, Triv MR, Mild TR, PASP .      Surgical History:  Past Surgical History:  Procedure Laterality Date  . ABDOMINAL HYSTERECTOMY    . APPENDECTOMY    . CARDIAC CATHETERIZATION    . CORONARY ARTERY BYPASS GRAFT    . LEFT HEART CATHETERIZATION WITH CORONARY ANGIOGRAM N/A 04/17/2012   Procedure: LEFT HEART CATHETERIZATION WITH CORONARY ANGIOGRAM;  Surgeon: Tonny Bollman, MD;  Location: [facility] CATH LAB;  Service: Cardiovascular;  Laterality: N/A;  . PERCUTANEOUS CORONARY STENT INTERVENTION (PCI-S) N/A 04/17/2012   Procedure: PERCUTANEOUS CORONARY STENT INTERVENTION (PCI-S);  Surgeon: Tonny Bollman, MD;  Location: [facility] CATH LAB;  Service: Cardiovascular;  Laterality: N/A;     Home Meds: Prior to Admission medications   Medication Sig Start Date End Date Taking? Authorizing Provider  acetaminophen (TYLENOL) 325 MG tablet Take 650 mg by mouth every 6 (six) hours as needed.   Yes [provider]  aspirin 81 MG EC tablet Take 4 tablets (325 mg total) by mouth daily. Patient taking differently: Take 81 mg by mouth daily.  04/18/12  Yes Ok Anis, NP  cetirizine (ZYRTEC) 10 MG tablet Take 10 mg by mouth daily as needed for allergies.   Yes [provider]  divalproex (  DEPAKOTE) 125 MG DR tablet Take 1 tablet by mouth at bedtime.  12/22/15  Yes [provider]  fluticasone (FLONASE) 50 MCG/ACT nasal spray Place 2 sprays into both nostrils daily as needed (FOR CONGESTION).   Yes [provider]  galantamine (RAZADYNE ER) 8 MG 24 hr capsule Take 1 capsule by mouth daily.  01/01/16  Yes [provider]   guaiFENesin-dextromethorphan (ROBITUSSIN DM) 100-10 MG/5ML syrup Take 10 mLs by mouth every 4 (four) hours as needed for cough.   Yes [provider]  magnesium hydroxide (MILK OF MAGNESIA) 400 MG/5ML suspension Take 30 mLs by mouth daily as needed for mild constipation.   Yes [provider]  metoprolol tartrate (LOPRESSOR) 25 MG tablet Take 12.5 mg by mouth 2 (two) times daily.    Yes [provider]  Multiple Vitamin (MULTIVITAMIN WITH MINERALS) TABS tablet Take 1 tablet by mouth daily.   Yes [provider]  polyethylene glycol (MIRALAX / GLYCOLAX) packet Take 17 g by mouth every 12 (twelve) hours as needed for mild constipation.   Yes [provider]  ranitidine (ZANTAC) 75 MG tablet Take 75 mg by mouth daily.    Yes [provider]  nitroGLYCERIN (NITROSTAT) 0.4 MG SL tablet Place 1 tablet (0.4 mg total) under the tongue every 5 (five) minutes x 3 doses as needed for chest pain. Patient not taking: Reported on 04/14/2017 04/18/12   Ok Anis, NP  traMADol (ULTRAM) 50 MG tablet Take 25 mg by mouth every 6 (six) hours as needed.  01/07/16   [provider]    Inpatient Medications:  . aspirin EC  81 mg Oral Daily  . divalproex  125 mg Oral QHS  . famotidine  20 mg Oral BID  . galantamine  8 mg Oral Daily  . metoprolol tartrate  12.5 mg Oral BID   . heparin 700 Units/hr (04/14/17 0415)    Allergies:  Allergies  Allergen Reactions  . Avelox [Moxifloxacin Hcl In Nacl] Hives  . Biaxin [Clarithromycin] Hives  . Ciprofloxacin Hives  . Codeine     hallucinations  . Darvocet [Propoxyphene N-Acetaminophen] Hives  . Entex Lq [Phenylephrine-Guaifenesin] Hives  . Iodine Hives  . Ivp Dye [Iodinated Diagnostic Agents] Hives  . Meperidine And Related Other (See Comments)    hallucinations  . Morphine And Related     hallucinations  . Oxycodone Hcl Other (See Comments)    hallucinations  . Phenergan [Promethazine  Hcl] Other (See Comments)    hallucinations  . Prednisone Other (See Comments)    Hallucinations  . Vancomycin Hives and Itching  . Zocor [Simvastatin]     Social History   Social History  . Marital status: Widowed    Spouse name: N/A  . Number of children: N/A  . Years of education: N/A   Occupational History  . Not on file.   Social History Main Topics  . Smoking status: Never Smoker  . Smokeless tobacco: Never Used  . Alcohol use No  . Drug use: No  . Sexual activity: Not on file   Other Topics Concern  . Not on file   Social History Narrative  . No narrative on file     Family History  Problem Relation Age of Onset  . CAD Brother      Review of Systems Cannot assess due to dementia Labs:  Recent Labs  04/13/17 2342 04/14/17 0210 04/14/17 0727  TROPONINI 0.04* 0.51* 25.31*   Lab Results  Component  Value Date   WBC 10.8 04/14/2017   HGB 12.2 04/14/2017   HCT 38.1 04/14/2017   MCV 84.3 04/14/2017   PLT 174 04/14/2017    Recent Labs Lab 04/13/17 2342  NA 138  K 4.4  CL 99*  CO2 28  BUN 14  CREATININE 0.84  CALCIUM 9.2  PROT 7.5  BILITOT 0.6  ALKPHOS 79  ALT 11*  AST 28  GLUCOSE 137*   Lab Results  Component Value Date   CHOL 238 (H) 04/18/2012   HDL 43 04/18/2012   LDLCALC 121 (H) 04/18/2012   TRIG 368 (H) 04/18/2012   No results found for: DDIMER  Radiology/Studies:  Dg Chest Port 1 View  Result Date: 04/14/2017 CLINICAL DATA:  83 year old female with shortness of breath. EXAM: PORTABLE CHEST 1 VIEW COMPARISON:  Chest radiograph dated 01/16/2016 FINDINGS: Mild diffuse chronic interstitial coarsening primarily at the lung bases. An area of increased density at the left lung base likely atelectatic changes/scarring. Developing infiltrate is not excluded. Clinical correlation is recommended. There is no pleural effusion or pneumothorax. There is mild cardiomegaly. There is atherosclerotic calcification of the aortic arch. Median  sternotomy wires and CABG vascular clips noted. Left pectoral pacemaker device. Osteopenia with degenerative changes of the spine. No acute osseous pathology. IMPRESSION: Left lung base airspace density may represent atelectatic changes or scarring. Developing infiltrate is not excluded. Clinical correlation is recommended. PA and lateral views of the chest may provide better evaluation. Mild cardiomegaly. Electronically Signed   By: Elgie CollardArash  Radparvar M.D.   On: 04/14/2017 00:15    EKG: AV pacing  Weights: Filed Weights   04/13/17 2312 04/14/17 0358  Weight: 59 kg (130 lb) 55.1 kg (121 lb 8 oz)     Physical Exam: Blood pressure 125/60, pulse 61, temperature 98.4 F (36.9 C), temperature source Oral, resp. rate 18, height 5\' 3"  (1.6 m), weight 55.1 kg (121 lb 8 oz), SpO2 93 %. Body mass index is 21.52 kg/m. General: Well developed, well nourished, in some acute distress. Head eyes ears nose throat: Normocephalic, atraumatic, sclera non-icteric, no xanthomas, nares are without discharge. No apparent thyromegaly and/or mass  Lungs: Normal respiratory effort.  no wheezes, no rales, no rhonchi.  Heart: RRR with normal S1 S2. no murmur gallop, no rub, PMI is normal size and placement, carotid upstroke normal without bruit, jugular venous pressure is normal Abdomen: Soft, non-tender, non-distended with normoactive bowel sounds. No hepatomegaly. No rebound/guarding. No obvious abdominal masses. Abdominal aorta is normal size without bruit Extremities: Trace edema. no cyanosis, no clubbing, no ulcers  Peripheral : 2+ bilateral upper extremity pulses, 2+ bilateral femoral pulses, 2+ bilateral dorsal pedal pulse Neuro: Not Alert and oriented. No facial asymmetry. No focal deficit. Moves all extremities spontaneously. Musculoskeletal: Normal muscle tone without kyphosis Psych:  Does not Responds to questions appropriately with a normal affect.    Assessment: 83 year old female with the coronary  artery disease status post coronary artery bypass graft of previous myocardial infarction with mild cardiomyopathy and heart block status post pacemaker placement with an acute non-ST elevation myocardial infarction  Plan: 1. Echocardiogram to assess basic extent of LV systolic dysfunction and a region of myocardial infarction 2. Continue metoprolol for heart rate blood pressure control status post myocardial infarction 3. Heparin for 48 hours for myocardial infarction and then potentially start aspirin and Plavix for further risk reduction thereafter 4. Additional medication management depending on echocardiogram results outcomes and the patient's ability to to ambulate thereafter  Signed, Zaquan Duffner  Mardene Sayer M.D. [facility] Cardiology 04/14/2017, 12:49 PM

## 2017-04-14 NOTE — Progress Notes (Signed)
ANTICOAGULATION CONSULT NOTE - Initial Consult  Pharmacy Consult for heparin drip Indication: chest pain/ACS/NSTEMI  Allergies  Allergen Reactions  . Avelox [Moxifloxacin Hcl In Nacl] Hives  . Biaxin [Clarithromycin] Hives  . Ciprofloxacin Hives  . Codeine     hallucinations  . Darvocet [Propoxyphene N-Acetaminophen] Hives  . Entex Lq [Phenylephrine-Guaifenesin] Hives  . Iodine Hives  . Ivp Dye [Iodinated Diagnostic Agents] Hives  . Meperidine And Related Other (See Comments)    hallucinations  . Morphine And Related     hallucinations  . Oxycodone Hcl Other (See Comments)    hallucinations  . Phenergan [Promethazine Hcl] Other (See Comments)    hallucinations  . Prednisone Other (See Comments)    Hallucinations  . Vancomycin Hives and Itching  . Zocor [Simvastatin]     Patient Measurements: Height: 5\' 3"  (160 cm) Weight: 121 lb 8 oz (55.1 kg) IBW/kg (Calculated) : 52.4 Heparin Dosing Weight: 59 kg  Vital Signs: Temp: 98.4 F (36.9 C) (10/30 1954) Temp Source: Oral (10/30 1954) BP: 139/78 (10/30 1954) Pulse Rate: 66 (10/30 1954)  Labs:  Recent Labs  04/13/17 2342 04/14/17 0029 04/14/17 0210 04/14/17 0242 04/14/17 0727 04/14/17 1301 04/14/17 2231  HGB  --  12.2  --   --   --   --   --   HCT  --  38.1  --   --   --   --   --   PLT  --  174  --   --   --   --   --   APTT  --   --   --  29  --   --   --   LABPROT 12.4  --   --   --   --   --   --   INR 0.93  --   --   --   --   --   --   HEPARINUNFRC  --   --   --   --   --  0.95* 0.45  CREATININE 0.84  --   --   --   --   --   --   TROPONINI 0.04*  --  0.51*  --  25.31*  --   --     Estimated Creatinine Clearance: 39.8 mL/min (by C-G formula based on SCr of 0.84 mg/dL).   Medical History: Past Medical History:  Diagnosis Date  . Alzheimer's disease   . Appendicitis   . CAD (coronary artery disease)    a. s/p MI and CABG;  b. 04/2012 Cath: LM 70d, LAD 100ost, LCX50-70, OM1 small, subtotal  occlusion, RCA 100p, VG->Diag patent, VG->dRCA patent, LIMA->LAD patent, EF40-45% diff HK w/ sev HK of basl inf wall and mod HK of periapical region.  . Cardiac pacemaker in situ   . Heart failure (HCC)   . Ischemic cardiomyopathy    a. 04/2012 Echo: EF 40-45%, mid-dist antersept DK, Gr 1 DD, Triv MR, Mild TR, PASP 35mmHg.    Medications:  No anticoagulation in PTA meds  Assessment:  Goal of Therapy:  Heparin level 0.3-0.7 units/ml Monitor platelets by anticoagulation protocol: Yes   Plan:  HL high at 0.95 Decrease rate to 550 units/hr. Recheck level in 6 hours  1030 2200 heparin level 0.45. Continue current regimen. Recheck confirmatory heparin level and CBC with tomorrow AM labs.  Fulton ReekMatt Tayvin Preslar, PharmD, BCPS  04/14/17 11:22 PM

## 2017-04-14 NOTE — H&P (Signed)
[facility] - [place] [facility]   PATIENT NAME: Joanna Park    MR#:  [id number]  DATE OF BIRTH:  01/12/1929  DATE OF ADMISSION:  04/13/2017  PRIMARY CARE PHYSICIAN: System, Pcp Not In   REQUESTING/REFERRING PHYSICIAN: Dr. York CeriseForbach  CHIEF COMPLAINT:   Chief Complaint  Patient presents with  . Chest Pain    HISTORY OF PRESENT ILLNESS:  Joanna Birchwoodatty Moring  is a 83 y.o. female with a known history of CAD, Dementia, HTN here with chest pain. Patient sent from local nursing home due to complaints of chest pain.  Patient is poor historian with her dementia.  Presently is chest pain-free.  Troponin normal.  EKG shows paced rhythm with concern for acute MI.  Cardiology was called and not thought to be ST elevation MI.  Chest x-ray shows left lower lobe infiltrate.  Patient has had cough.  Afebrile. Daughter at bedside.  History obtained from daughter at bedside and reviewing old records.  PAST MEDICAL HISTORY:   Past Medical History:  Diagnosis Date  . Alzheimer's disease   . Appendicitis   . CAD (coronary artery disease)    a. s/p MI and CABG;  b. 04/2012 Cath: LM 70d, LAD 100ost, LCX50-70, OM1 small, subtotal occlusion, RCA 100p, VG->Diag patent, VG->dRCA patent, LIMA->LAD patent, EF40-45% diff HK w/ sev HK of basl inf wall and mod HK of periapical region.  . Cardiac pacemaker in situ   . Heart failure (HCC)   . Ischemic cardiomyopathy    a. 04/2012 Echo: EF 40-45%, mid-dist antersept DK, Gr 1 DD, Triv MR, Mild TR, PASP 35mmHg.    PAST SURGICAL HISTORY:   Past Surgical History:  Procedure Laterality Date  . ABDOMINAL HYSTERECTOMY    . APPENDECTOMY    . CARDIAC CATHETERIZATION    . CORONARY ARTERY BYPASS GRAFT    . LEFT HEART CATHETERIZATION WITH CORONARY ANGIOGRAM N/A 04/17/2012   Procedure: LEFT HEART CATHETERIZATION WITH CORONARY ANGIOGRAM;  Surgeon: Tonny BollmanMichael Cooper, MD;  Location: [facility] C[facility] LAB;  Service: Cardiovascular;  Laterality: N/A;  . PERCUTANEOUS  CORONARY STENT INTERVENTION (PCI-S) N/A 04/17/2012   Procedure: PERCUTANEOUS CORONARY STENT INTERVENTION (PCI-S);  Surgeon: Tonny BollmanMichael Cooper, MD;  [facility];  Service: Cardiovascular;  Laterality: N/A;    SOCIAL HISTORY:   Social History  Substance Use Topics  . Smoking status: Never Smoker  . Smokeless tobacco: Never Used  . Alcohol use No    FAMILY HISTORY:   Family History  Problem Relation Age of Onset  . CAD Brother     DRUG ALLERGIES:   Allergies  Allergen Reactions  . Avelox [Moxifloxacin Hcl In Nacl] Hives  . Biaxin [Clarithromycin] Hives  . Ciprofloxacin Hives  . Codeine     hallucinations  . Darvocet [Propoxyphene N-Acetaminophen] Hives  . Entex Lq [Phenylephrine-Guaifenesin] Hives  . Iodine Hives  . Ivp Dye [Iodinated Diagnostic Agents] Hives  . Meperidine And Related Other (See Comments)    hallucinations  . Morphine And Related     hallucinations  . Oxycodone Hcl Other (See Comments)    hallucinations  . Phenergan [Promethazine Hcl] Other (See Comments)    hallucinations  . Prednisone Other (See Comments)    Hallucinations  . Zocor [Simvastatin]     REVIEW OF SYSTEMS:   Review of Systems  Unable to perform ROS: Dementia    MEDICATIONS AT HOME:   Prior to Admission medications   Medication Sig Start Date End Date Taking? Authorizing Provider  acetaminophen (TYLENOL) 325  MG tablet Take 650 mg by mouth every 6 (six) hours as needed.   Yes [provider]  aspirin 81 MG EC tablet Take 4 tablets (325 mg total) by mouth daily. Patient taking differently: Take 81 mg by mouth daily.  04/18/12  Yes Ok Anis, NP  cetirizine (ZYRTEC) 10 MG tablet Take 10 mg by mouth daily as needed for allergies.   Yes [provider]  divalproex (DEPAKOTE) 125 MG DR tablet Take 1 tablet by mouth at bedtime.  12/22/15  Yes [provider]  fluticasone (FLONASE) 50 MCG/ACT nasal spray Place 2 sprays into both nostrils daily as  needed (FOR CONGESTION).   Yes [provider]  galantamine (RAZADYNE ER) 8 MG 24 hr capsule Take 1 capsule by mouth daily.  01/01/16  Yes [provider]  guaiFENesin-dextromethorphan (ROBITUSSIN DM) 100-10 MG/5ML syrup Take 10 mLs by mouth every 4 (four) hours as needed for cough.   Yes [provider]  magnesium hydroxide (MILK OF MAGNESIA) 400 MG/5ML suspension Take 30 mLs by mouth daily as needed for mild constipation.   Yes [provider]  metoprolol tartrate (LOPRESSOR) 25 MG tablet Take 12.5 mg by mouth 2 (two) times daily.    Yes [provider]  Multiple Vitamin (MULTIVITAMIN WITH MINERALS) TABS tablet Take 1 tablet by mouth daily.   Yes [provider]  polyethylene glycol (MIRALAX / GLYCOLAX) packet Take 17 g by mouth every 12 (twelve) hours as needed for mild constipation.   Yes [provider]  ranitidine (ZANTAC) 75 MG tablet Take 75 mg by mouth daily.    Yes [provider]  nitroGLYCERIN (NITROSTAT) 0.4 MG SL tablet Place 1 tablet (0.4 mg total) under the tongue every 5 (five) minutes x 3 doses as needed for chest pain. Patient not taking: Reported on 04/14/2017 04/18/12   Ok Anis, NP  traMADol (ULTRAM) 50 MG tablet Take 25 mg by mouth every 6 (six) hours as needed.  01/07/16   [provider]     VITAL SIGNS:  Blood pressure (!) 148/88, pulse 65, temperature 97.8 F (36.6 C), temperature source Rectal, resp. rate 19, height 5\' 3"  (1.6 m), weight 59 kg (130 lb), SpO2 97 %.  PHYSICAL EXAMINATION:  Physical Exam  GENERAL:  83 y.o.-year-old patient lying in the bed with no acute distress.  EYES: Pupils equal, round, reactive to light and accommodation. No scleral icterus. Extraocular muscles intact.  HEENT: Head atraumatic, normocephalic. Oropharynx and nasopharynx clear. No oropharyngeal erythema, moist oral mucosa. NECK:  Supple, no jugular venous distention. No thyroid enlargement, no  tenderness.  LUNGS: Normal breath sounds bilaterally, no wheezing, rales, rhonchi. No use of accessory muscles of respiration. CARDIOVASCULAR: S1, S2 normal. No murmurs, rubs, or gallops.  ABDOMEN: Soft, nontender, nondistended. Bowel sounds present. No organomegaly or mass.  EXTREMITIES: No pedal edema, cyanosis, or clubbing. + 2 pedal & radial pulses b/l.   NEUROLOGIC: Cranial nerves II through XII are intact. No focal Motor or sensory deficits appreciated b/l PSYCHIATRIC: The patient is alert and awake. confused SKIN: No obvious rash, lesion, or ulcer.   LABORATORY PANEL:   CBC  Recent Labs Lab 04/14/17 0029  WBC 10.8  HGB 12.2  HCT 38.1  PLT 174   ------------------------------------------------------------------------------------------------------------------  Chemistries   Recent Labs Lab 04/13/17 2342  NA 138  K 4.4  CL 99*  CO2 28  GLUCOSE 137*  BUN 14  CREATININE 0.84  CALCIUM 9.2  MG 2.0  AST  28  ALT 11*  ALKPHOS 79  BILITOT 0.6   ------------------------------------------------------------------------------------------------------------------  Cardiac Enzymes  Recent Labs Lab 04/13/17 2342  TROPONINI 0.04*   ------------------------------------------------------------------------------------------------------------------  RADIOLOGY:  Dg Chest Port 1 View  Result Date: 04/14/2017 CLINICAL DATA:  83 year old female with shortness of breath. EXAM: PORTABLE CHEST 1 VIEW COMPARISON:  Chest radiograph dated 01/16/2016 FINDINGS: Mild diffuse chronic interstitial coarsening primarily at the lung bases. An area of increased density at the left lung base likely atelectatic changes/scarring. Developing infiltrate is not excluded. Clinical correlation is recommended. There is no pleural effusion or pneumothorax. There is mild cardiomegaly. There is atherosclerotic calcification of the aortic arch. Median sternotomy wires and CABG vascular clips noted. Left  pectoral pacemaker device. Osteopenia with degenerative changes of the spine. No acute osseous pathology. IMPRESSION: Left lung base airspace density may represent atelectatic changes or scarring. Developing infiltrate is not excluded. Clinical correlation is recommended. PA and lateral views of the chest may provide better evaluation. Mild cardiomegaly. Electronically Signed   By: Elgie Collard M.D.   On: 04/14/2017 00:15     IMPRESSION AND PLAN:   * NSTEMI Repeated a STAT troponin in 2 hrs from initial and elevated to 0.51. Will start heparin drip.  ASA. Cardiology consult for cath. Echo.  * Lower lobe pneumonia IV antibiotics.  Cultures.  Nebs as needed.  * Chronic systolic chf stable  All the records are reviewed and case discussed with ED provider. Management plans discussed with the patient, family and they are in agreement.  CODE STATUS: FULL CODE  TOTAL TIME TAKING CARE OF THIS PATIENT: 40 minutes.   Milagros Loll R M.D on 04/14/2017 at 2:29 AM  Between 7am to 6pm - Pager - [phone]  After 6pm go to [email or web address] - password [email or web address]  [facility]  Office  [phone]  CC: Primary care physician; System, Pcp Not In  Note: This dictation was prepared with Dragon dictation along with smaller phrase technology. Any transcriptional errors that result from this process are unintentional.

## 2017-04-14 NOTE — Clinical Social Work Note (Signed)
CSW spoke to patient and her family and they would like patient to return back to [facility] she [facility] a [facility] care resident, formal assessment to follow.  Ervin KnackEric R. Azalie Harbeck, MSW, Theresia MajorsLCSWA [phone]  04/14/2017 5:10 PM

## 2017-04-14 NOTE — ED Notes (Signed)
Pt cleaned and briefs changed; pt transported to rm [id number] by ED Tech Ashlyn.

## 2017-04-14 NOTE — Progress Notes (Signed)
ANTICOAGULATION CONSULT NOTE - Initial Consult  Pharmacy Consult for heparin drip Indication: chest pain/ACS/NSTEMI  Allergies  Allergen Reactions  . Avelox [Moxifloxacin Hcl In Nacl] Hives  . Biaxin [Clarithromycin] Hives  . Ciprofloxacin Hives  . Codeine     hallucinations  . Darvocet [Propoxyphene N-Acetaminophen] Hives  . Entex Lq [Phenylephrine-Guaifenesin] Hives  . Iodine Hives  . Ivp Dye [Iodinated Diagnostic Agents] Hives  . Meperidine And Related Other (See Comments)    hallucinations  . Morphine And Related     hallucinations  . Oxycodone Hcl Other (See Comments)    hallucinations  . Phenergan [Promethazine Hcl] Other (See Comments)    hallucinations  . Prednisone Other (See Comments)    Hallucinations  . Vancomycin Hives and Itching  . Zocor [Simvastatin]     Patient Measurements: Height: 5\' 3"  (160 cm) Weight: 121 lb 8 oz (55.1 kg) IBW/kg (Calculated) : 52.4 Heparin Dosing Weight: 59 kg  Vital Signs: Temp: 98.4 F (36.9 C) (10/30 0913) Temp Source: Oral (10/30 0913) BP: 125/60 (10/30 0913) Pulse Rate: 61 (10/30 0913)  Labs:  Recent Labs  04/13/17 2342 04/14/17 0029 04/14/17 0210 04/14/17 0242 04/14/17 0727 04/14/17 1301  HGB  --  12.2  --   --   --   --   HCT  --  38.1  --   --   --   --   PLT  --  174  --   --   --   --   APTT  --   --   --  29  --   --   LABPROT 12.4  --   --   --   --   --   INR 0.93  --   --   --   --   --   HEPARINUNFRC  --   --   --   --   --  0.95*  CREATININE 0.84  --   --   --   --   --   TROPONINI 0.04*  --  0.51*  --  25.31*  --     Estimated Creatinine Clearance: 39.8 mL/min (by C-G formula based on SCr of 0.84 mg/dL).   Medical History: Past Medical History:  Diagnosis Date  . Alzheimer's disease   . Appendicitis   . CAD (coronary artery disease)    a. s/p MI and CABG;  b. 04/2012 Cath: LM 70d, LAD 100ost, LCX50-70, OM1 small, subtotal occlusion, RCA 100p, VG->Diag patent, VG->dRCA patent, LIMA->LAD  patent, EF40-45% diff HK w/ sev HK of basl inf wall and mod HK of periapical region.  . Cardiac pacemaker in situ   . Heart failure (HCC)   . Ischemic cardiomyopathy    a. 04/2012 Echo: EF 40-45%, mid-dist antersept DK, Gr 1 DD, Triv MR, Mild TR, PASP 35mmHg.    Medications:  No anticoagulation in PTA meds  Assessment:  Goal of Therapy:  Heparin level 0.3-0.7 units/ml Monitor platelets by anticoagulation protocol: Yes   Plan:  HL high at 0.95 Decrease rate to 550 units/hr. Recheck level in 6 hours  Melissa D Maccia, Pharm.D, BCPS Clinical Pharmacist  04/14/2017,1:50 PM

## 2017-04-14 NOTE — Progress Notes (Signed)
*  PRELIMINARY RESULTS* Echocardiogram 2D Echocardiogram has been performed.  Cristela BlueHege, Rin Gorton 04/14/2017, 1:21 PM

## 2017-04-14 NOTE — ED Provider Notes (Addendum)
[facility] Emergency Department Provider Note  ____________________________________________   First MD Initiated Contact with Patient 04/13/17 2336     (approximate)  I have reviewed the triage vital signs and the nursing notes.   HISTORY  Chief Complaint Chest Pain  Level 5 caveat:  history/ROS limited by chronic dementia  HPI Joanna Park is a 83 y.o. female with extensive past medical history including but not limited to dementia, CAD status post MI and CABG, sick sinus syndrome with dual-chamber pacemaker, ischemic cardiomyopathy, etc.  She presents by EMS for what was reported as acute onset chest pain radiating to her back at the nursing facility.  Reportedly this started about an hour prior to arrival, but the patient states that she has been feeling bad since yesterday.  She reports that she had some shortness of breath and became acutely nauseated.  The symptoms are reportedly severe.  Nothing in particular makes them better nor worse  Past Medical History:  Diagnosis Date  . Alzheimer's disease   . Appendicitis   . CAD (coronary artery disease)    a. s/p MI and CABG;  b. 04/2012 Cath: LM 70d, LAD 100ost, LCX50-70, OM1 small, subtotal occlusion, RCA 100p, VG->Diag patent, VG->dRCA patent, LIMA->LAD patent, EF40-45% diff HK w/ sev HK of basl inf wall and mod HK of periapical region.  . Cardiac pacemaker in situ   . Heart failure (HCC)   . Ischemic cardiomyopathy    a. 04/2012 Echo: EF 40-45%, mid-dist antersept DK, Gr 1 DD, Triv MR, Mild TR, PASP .    Patient Active Problem List   Diagnosis Date Noted  . Chest pain 04/14/2017  . Unstable angina (HCC) 04/18/2012  . CAD (coronary artery disease) of artery bypass graft 04/18/2012  . Hyperlipidemia 04/18/2012  . Cardiac pacemaker in situ 04/18/2012  . Hypertension 04/18/2012  . Essential tremor 04/18/2012  . Ischemic cardiomyopathy   . CAD (coronary artery disease)     Past Surgical  History:  Procedure Laterality Date  . ABDOMINAL HYSTERECTOMY    . APPENDECTOMY    . CARDIAC CATHETERIZATION    . CORONARY ARTERY BYPASS GRAFT    . LEFT HEART CATHETERIZATION WITH CORONARY ANGIOGRAM N/A 04/17/2012   Procedure: LEFT HEART CATHETERIZATION WITH CORONARY ANGIOGRAM;  Surgeon: Tonny Bollman, MD;  Location: [facility] CATH LAB;  Service: Cardiovascular;  Laterality: N/A;  . PERCUTANEOUS CORONARY STENT INTERVENTION (PCI-S) N/A 04/17/2012   Procedure: PERCUTANEOUS CORONARY STENT INTERVENTION (PCI-S);  Surgeon: Tonny Bollman, MD;  Location: [facility] CATH LAB;  Service: Cardiovascular;  Laterality: N/A;    Prior to Admission medications   Medication Sig Start Date End Date Taking? Authorizing Provider  acetaminophen (TYLENOL) 325 MG tablet Take 650 mg by mouth every 6 (six) hours as needed.   Yes [provider]  aspirin 81 MG EC tablet Take 4 tablets (325 mg total) by mouth daily. Patient taking differently: Take 81 mg by mouth daily.  04/18/12  Yes Ok Anis, NP  cetirizine (ZYRTEC) 10 MG tablet Take 10 mg by mouth daily as needed for allergies.   Yes [provider]  divalproex (DEPAKOTE) 125 MG DR tablet Take 1 tablet by mouth at bedtime.  12/22/15  Yes [provider]  fluticasone (FLONASE) 50 MCG/ACT nasal spray Place 2 sprays into both nostrils daily as needed (FOR CONGESTION).   Yes [provider]  galantamine (RAZADYNE ER) 8 MG 24 hr capsule Take 1 capsule by mouth daily.  01/01/16  Yes [provider]  guaiFENesin-dextromethorphan (ROBITUSSIN DM) 100-10 MG/5ML syrup Take 10 mLs by mouth every 4 (four) hours as needed for cough.   Yes [provider]  magnesium hydroxide (MILK OF MAGNESIA) 400 MG/5ML suspension Take 30 mLs by mouth daily as needed for mild constipation.   Yes [provider]  metoprolol tartrate (LOPRESSOR) 25 MG tablet Take 12.5 mg by mouth 2 (two) times daily.    Yes [provider]  Multiple  Vitamin (MULTIVITAMIN WITH MINERALS) TABS tablet Take 1 tablet by mouth daily.   Yes [provider]  polyethylene glycol (MIRALAX / GLYCOLAX) packet Take 17 g by mouth every 12 (twelve) hours as needed for mild constipation.   Yes [provider]  ranitidine (ZANTAC) 75 MG tablet Take 75 mg by mouth daily.    Yes [provider]  nitroGLYCERIN (NITROSTAT) 0.4 MG SL tablet Place 1 tablet (0.4 mg total) under the tongue every 5 (five) minutes x 3 doses as needed for chest pain. Patient not taking: Reported on 04/14/2017 04/18/12   Ok AnisBerge, Christopher R, NP  traMADol (ULTRAM) 50 MG tablet Take 25 mg by mouth every 6 (six) hours as needed.  01/07/16   [provider]    Allergies Avelox [moxifloxacin hcl in nacl]; Biaxin [clarithromycin]; Ciprofloxacin; Codeine; Darvocet [propoxyphene n-acetaminophen]; Entex lq [phenylephrine-guaifenesin]; Iodine; Ivp dye [iodinated diagnostic agents]; Meperidine and related; Morphine and related; Oxycodone hcl; Phenergan [promethazine hcl]; Prednisone; and Zocor [simvastatin]  Family History  Problem Relation Age of Onset  . CAD Brother     Social History Social History  Substance Use Topics  . Smoking status: Never Smoker  . Smokeless tobacco: Never Used  . Alcohol use No    Review of Systems Level 5 caveat:  history/ROS limited by chronic dementia, but positive for chest pain, shortness of breath, nausea, all acute in onset  ____________________________________________   PHYSICAL EXAM:  VITAL SIGNS: ED Triage Vitals  Enc Vitals Group     BP 04/13/17 2300 (!) 148/92     Pulse Rate 04/13/17 2312 66     Resp 04/13/17 2300 (!) 33     Temp 04/13/17 2312 97.8 F (36.6 C)     Temp Source 04/13/17 2312 Rectal     SpO2 04/13/17 2259 95 %     Weight 04/13/17 2312 59 kg (130 lb)     Height 04/13/17 2312 1.6 m (5\' 3" )     Head Circumference --      Peak Flow --      Pain Score --      Pain Loc --      Pain Edu? --       Excl. in GC? --     Constitutional: Alert, no acute distress, but somewhat ill-appearing Eyes: Conjunctivae are normal.  Head: Atraumatic. Nose: No congestion/rhinnorhea. Mouth/Throat: Mucous membranes are moist. Neck: No stridor.  No meningeal signs.   Cardiovascular: Ventricular paced rhythm with frequent PVCs. Good peripheral circulation. Grossly normal heart sounds. Respiratory: Increased respiratory rate but normal effort.  No retractions. Lungs CTAB. Gastrointestinal: Soft with mild diffuse tenderness throughout but no focal tenderness and no distention.  No rebound no guarding Musculoskeletal: No lower extremity tenderness nor edema. No gross deformities of extremities. Neurologic:  Normal speech and language. No gross focal neurologic deficits are appreciated.  Skin:  Skin is pale, warm, dry and intact. No rash noted.  ____________________________________________   LABS (all labs ordered are listed, but only abnormal results are displayed)  Labs Reviewed  TROPONIN I - Abnormal; Notable for the following:       Result Value   Troponin I 0.04 (*)    All other components within normal limits  COMPREHENSIVE METABOLIC PANEL - Abnormal; Notable for the following:    Chloride 99 (*)    Glucose, Bld 137 (*)    ALT 11 (*)    All other components within normal limits  CBC - Abnormal; Notable for the following:    RDW 14.7 (*)    All other components within normal limits  TROPONIN I - Abnormal; Notable for the following:    Troponin I 0.51 (*)    All other components within normal limits  CULTURE, BLOOD (ROUTINE X 2)  CULTURE, BLOOD (ROUTINE X 2) W REFLEX TO ID PANEL  LIPASE, BLOOD  MAGNESIUM  PROTIME-INR  TROPONIN I  APTT   ____________________________________________  EKG  ED ECG REPORT I, Sameerah Nachtigal, the attending physician, personally viewed and interpreted this ECG.  Date: 04/13/2017 EKG Time: 23: 01 Rate: 65 Rhythm: AV dual paced rhythm QRS Axis: AV  dual paced rhythm Intervals: Dual paced rhythm ST/T Wave abnormalities: Lead V2 has concordant QRS and T wave depression which is concerning for STEMI based on Sgarbossa criteria.  She also has discordant ST elevation particularly notable in leads II, III, and aVF but also in V5 and V6.  Her last EKG against which we can compare was only atrial paced and is not an easy direct comparison Narrative Interpretation: Concerning for acute ischemia/STEMI based on Sgarbossa criteria  ____________________________________________  RADIOLOGY I, Koby Pickup, personally viewed and evaluated these images (plain radiographs) as part of my medical decision making, as well as reviewing the written report by the radiologist.  Dg Chest Port 1 View  Result Date: 04/14/2017 CLINICAL DATA:  83 year old female with shortness of breath. EXAM: PORTABLE CHEST 1 VIEW COMPARISON:  Chest radiograph dated 01/16/2016 FINDINGS: Mild diffuse chronic interstitial coarsening primarily at the lung bases. An area of increased density at the left lung base likely atelectatic changes/scarring. Developing infiltrate is not excluded. Clinical correlation is recommended. There is no pleural effusion or pneumothorax. There is mild cardiomegaly. There is atherosclerotic calcification of the aortic arch. Median sternotomy wires and CABG vascular clips noted. Left pectoral pacemaker device. Osteopenia with degenerative changes of the spine. No acute osseous pathology. IMPRESSION: Left lung base airspace density may represent atelectatic changes or scarring. Developing infiltrate is not excluded. Clinical correlation is recommended. PA and lateral views of the chest may provide better evaluation. Mild cardiomegaly. Electronically Signed   By: Elgie Collard M.D.   On: 04/14/2017 00:15    ____________________________________________   PROCEDURES  Critical Care performed: Yes, see critical care procedure note(s)   Procedure(s)  performed:   .Critical Care Performed by: Loleta Rose Authorized by: Loleta Rose   Critical care provider statement:    Critical care time (minutes):  30   Critical care time was exclusive of:  Separately billable procedures and treating other patients   Critical care was necessary to treat or prevent imminent or life-threatening deterioration of the following conditions:  Cardiac failure   Critical care was time spent personally by me on the following activities:  Development of treatment plan with patient or surrogate, discussions with consultants, evaluation of patient's response to treatment, examination of patient, obtaining history from patient or surrogate, ordering and performing treatments and interventions, ordering and review of laboratory studies, ordering and review of radiographic studies, pulse oximetry, re-evaluation of patient's condition  and review of old charts      ____________________________________________   INITIAL IMPRESSION / ASSESSMENT AND PLAN / ED COURSE  As part of my medical decision making, I reviewed the following data within the electronic MEDICAL RECORD NUMBER History obtained from family, Nursing notes reviewed and incorporated, Labs reviewed , EKG interpreted , Old EKG reviewed, Radiograph reviewed , Discussed with admitting physician (Dr. Elpidio Anis), Discussed with cardiologist (Dr. Kirke Corin) and Notes from prior ED visits      Clinical Course as of Apr 14 249  Mon Apr 13, 2017  2350 The patient's nurse brought to my partner's attention that this patient is ill-appearing.  He found the EKG on our desk that had been obtained about 45 minutes ago, and he picked up on the fact that it meets Sgarbossa criteria for STEMI with a pacer.  I paged Dr. Kirke Corin to discuss (he is covering cath lab call).  [CF]  2351 Differential diagnosis is broad and includes all the usual potentially life-threatening causes of chest pain including but not limited to healthcare  associated pneumonia, ACS, PE, aortic dissection, pneumothorax.  Additionally, given that she has dementia and is a poor historian, we should also consider the possibility of intra-abdominal infection or abnormality in general.  A broad workup is necessary although additional imaging is not indicated at this time.  [CF]  2356 Spoke by phone with Dr. Kirke Corin.  He asked me to send the ECGs to his phone which I have gone.  Awaiting his recommendations.  Blood has been sent to the cath lab.  [CF]  Tue Apr 14, 2017  0008 Discussed case by phone with Dr. Kirke Corin.  He agrees that the EKG is concerning but not absolutely consistent with STEMI and we do not have an AV-paced EKG against which to compare.  Additionally, with her age and comorbidities, she is not a good candidate for the Cath Lab and it may be potentially damaging to her to take her for early and aggressive intervention.  He recommended seeing what is her troponin and the rest of her workup and then start her on heparin if necessary.  I will attempt to track down a family member to update them.  The patient now has a peripheral IV and lab work is pending.  [CF]  0026 Viewed the CXR and reviewed the radiology report.  I am concerned about the LLL opacity; radiology less convinced it represent infiltrate but acknowledges it is possible.  Awaiting the rest of her lab results. DG Chest Port 1 View [CF]  [phone] Speaking with daughter now.  She confirmed full code.  Daughter and son are co-powers of attorney.  They will both be coming to the ED soon.  I advised her of my concern of myocardial infarction possibly in the setting of left lower lobe pneumonia, explained why we are not immediately taking her to the Cath Lab, and expressed my concern for a guarded prognosis.  She understands.  [CF]  0146 Troponin is only 0.04 which is reassuring but still may be indicative of demand ischemia.  Given her comorbidities, I will not start heparin bolus and drip at this time.   I am giving her a full dose aspirin and I have added on pro time INR and APTT.  I discussed the case at bedside with the patient's daughter and explained that what I believe is happening is that she has a developing left lower lobe pneumonia leading to demand ischemia.  Both of these are likely  contributing to her chest pain as well as her EKG changes and her elevated troponin I.  The patient's daughter agrees that she is pale and does not seem to be her normal self.  The patient has not changed clinically since arrival.  She is conversant and continues to complain of some chest discomfort.  She has not had any vomiting since coming to the emergency department.  I have ordered 2 mg of morphine to see if this helps alleviate some of her discomfort.  I discussed the case by phone with Dr. Elpidio Anis with the hospitalist service and he will admit her. Troponin I: (!!) 0.04 [CF]  0243 Repeat troponin has climbed to 0.51.  This is indicative of NSTEMI and I will order heparin as per my discussion with Dr. Kirke Corin.  Will update family.  [CF]    Clinical Course User Index [CF] Loleta Rose, MD    ____________________________________________  FINAL CLINICAL IMPRESSION(S) / ED DIAGNOSES  Final diagnoses:  HCAP (healthcare-associated pneumonia)  Chest pain, unspecified type  Elevated troponin I level  Abnormal EKG  Dementia without behavioral disturbance, unspecified dementia type  NSTEMI (non-ST elevated myocardial infarction) (HCC)     MEDICATIONS GIVEN DURING THIS VISIT:  Medications  acetaminophen (TYLENOL) tablet 650 mg (not administered)    Or  acetaminophen (TYLENOL) suppository 650 mg (not administered)  polyethylene glycol (MIRALAX / GLYCOLAX) packet 17 g (not administered)  ondansetron (ZOFRAN) tablet 4 mg (not administered)    Or  ondansetron (ZOFRAN) injection 4 mg (not administered)  albuterol (PROVENTIL) (2.5 MG/3ML) 0.083% nebulizer solution 2.5 mg (not administered)  fluticasone  (FLONASE) 50 MCG/ACT nasal spray 2 spray (not administered)  polyethylene glycol (MIRALAX / GLYCOLAX) packet 17 g (not administered)  divalproex (DEPAKOTE) DR tablet 125 mg (not administered)  galantamine (RAZADYNE ER) 24 hr capsule 8 mg (not administered)  guaiFENesin-dextromethorphan (ROBITUSSIN DM) 100-10 MG/5ML syrup 10 mL (not administered)  famotidine (PEPCID) tablet 20 mg (not administered)  traMADol (ULTRAM) tablet 50 mg (not administered)  metoprolol tartrate (LOPRESSOR) tablet 12.5 mg (not administered)  aspirin EC tablet 81 mg (not administered)  heparin injection 3,550 Units (not administered)  ceFEPIme (MAXIPIME) 2 g in dextrose 5 % 50 mL IVPB (2 g Intravenous New Bag/Given 04/14/17 0200)  vancomycin (VANCOCIN) IVPB 1000 mg/200 mL premix (0 mg Intravenous Stopped 04/14/17 0204)  aspirin chewable tablet 324 mg (324 mg Oral Given 04/14/17 0148)  sodium chloride 0.9 % bolus 250 mL (250 mLs Intravenous New Bag/Given 04/14/17 0138)  morphine 2 MG/ML injection 2 mg (2 mg Intravenous Given 04/14/17 0148)     NEW OUTPATIENT MEDICATIONS STARTED DURING THIS VISIT:  New Prescriptions   No medications on file    Modified Medications   No medications on file    Discontinued Medications   No medications on file     Note:  This document was prepared using Dragon voice recognition software and may include unintentional dictation errors.    Loleta Rose, MD 04/14/17 Lorine Bears    Loleta Rose, MD 04/14/17 [phone]

## 2017-04-14 NOTE — Progress Notes (Signed)
Pharmacy Antibiotic Note  Joanna Park is a 83 y.o. female admitted on 04/13/2017 with HCAP.  Pharmacy has been consulted for cefepime dosing.  Plan: Cefepime 2 grams q 12 hours ordered.  Height: 5\' 3"  (160 cm) Weight: 130 lb (59 kg) IBW/kg (Calculated) : 52.4  Temp (24hrs), Avg:97.8 F (36.6 C), Min:97.8 F (36.6 C), Max:97.8 F (36.6 C)   Recent Labs Lab 04/13/17 2342 04/14/17 0029  WBC  --  10.8  CREATININE 0.84  --     Estimated Creatinine Clearance: 39.8 mL/min (by C-G formula based on SCr of 0.84 mg/dL).    Allergies  Allergen Reactions  . Avelox [Moxifloxacin Hcl In Nacl] Hives  . Biaxin [Clarithromycin] Hives  . Ciprofloxacin Hives  . Codeine     hallucinations  . Darvocet [Propoxyphene N-Acetaminophen] Hives  . Entex Lq [Phenylephrine-Guaifenesin] Hives  . Iodine Hives  . Ivp Dye [Iodinated Diagnostic Agents] Hives  . Meperidine And Related Other (See Comments)    hallucinations  . Morphine And Related     hallucinations  . Oxycodone Hcl Other (See Comments)    hallucinations  . Phenergan [Promethazine Hcl] Other (See Comments)    hallucinations  . Prednisone Other (See Comments)    Hallucinations  . Vancomycin Hives and Itching  . Zocor [Simvastatin]     Antimicrobials this admission: Vancomycin x1, cefepime 10/30 >>    >>   Dose adjustments this admission:   Microbiology results: 10/30  BCx: pending      10/30 CXR: L base density, infiltrate not excluded  Thank you for allowing pharmacy to be a part of this patient's care.  Jash Wahlen S 04/14/2017 3:28 AM

## 2017-04-14 NOTE — Progress Notes (Signed)
Initial Nutrition Assessment  DOCUMENTATION CODES:   Non-severe (moderate) malnutrition in context of chronic illness  INTERVENTION:   Ensure Enlive po BID, each supplement provides 350 kcal and 20 grams of protein  MVI daily  Dysphagia 3 diet   Magic cup TID with meals, each supplement provides 290 kcal and 9 grams of protein  NUTRITION DIAGNOSIS:   Moderate Malnutrition related to chronic illness (dementia, CHF, heart disease) as evidenced by moderate fat depletion, moderate muscle depletion.  GOAL:   Patient will meet greater than or equal to 90% of their needs  MONITOR:   PO intake, Supplement acceptance, Labs, Weight trends, I & O's  REASON FOR ASSESSMENT:   Malnutrition Screening Tool    ASSESSMENT:   83 y.o. female with extensive past medical history including but not limited to dementia, CAD status post MI and CABG, sick sinus syndrome with dual-chamber pacemaker, ischemic cardiomyopathy presents with chest pain    Met with pt and family in room today. Pt unable to provide any history so information obtained from pt's family at bedside. Family reports pt with poor appetite and oral intake for several months pta; pt just picks at her food. Pt does drink chocolate Ensure if its really cold and loves chocolate ice cream. Per chart, pt is weight stable since April. Pt does have poor dentition and requires a dysphagia 3 diet. Pt is NPO today for possible cardiology tests. Discussed with family the importance of adequate protein intake and encouraged supplements.   Medications reviewed and include: aspirin, pepcid, heparin, morphine, zofran  Labs reviewed:   Nutrition-Focused physical exam completed. Findings are moderate fat depletions in arms, chest, orbital regions and cheecks, moderate muscle depletion in clavicles, hands, and shoulders, unable to visualize BLE, and no edema.   Diet Order:  DIET DYS 3 Room service appropriate? Yes; Fluid consistency:  Thin  EDUCATION NEEDS:   Not appropriate for education at this time  Skin:  Reviewed RN Assessment  Last BM:  PTA  Height:   Ht Readings from Last 1 Encounters:  04/13/17 _0  (1.6 m)    Weight:   Wt Readings from Last 1 Encounters:  04/14/17 121 lb 8 oz (55.1 kg)    Ideal Body Weight:  52.3 kg  BMI:  Body mass index is 21.52 kg/m.  Estimated Nutritional Needs:   Kcal:  1600-1800kcal/day   Protein:  83-94g/day   Fluid:  >1.6L/day   Koleen Distance MS, RD, LDN Pager #[phone] After Hours Pager: [phone]

## 2017-04-15 DIAGNOSIS — F0390 Unspecified dementia without behavioral disturbance: Secondary | ICD-10-CM

## 2017-04-15 DIAGNOSIS — I214 Non-ST elevation (NSTEMI) myocardial infarction: Principal | ICD-10-CM

## 2017-04-15 LAB — CBC
HEMATOCRIT: 36.1 % (ref 35.0–47.0)
HEMOGLOBIN: 11.8 g/dL — AB (ref 12.0–16.0)
MCH: 27.4 pg (ref 26.0–34.0)
MCHC: 32.6 g/dL (ref 32.0–36.0)
MCV: 84 fL (ref 80.0–100.0)
Platelets: 172 10*3/uL (ref 150–440)
RBC: 4.31 MIL/uL (ref 3.80–5.20)
RDW: 14.7 % — ABNORMAL HIGH (ref 11.5–14.5)
WBC: 12.6 10*3/uL — ABNORMAL HIGH (ref 3.6–11.0)

## 2017-04-15 LAB — HEPARIN LEVEL (UNFRACTIONATED): HEPARIN UNFRACTIONATED: 0.29 [IU]/mL — AB (ref 0.30–0.70)

## 2017-04-15 MED ORDER — METOPROLOL TARTRATE 25 MG PO TABS
25.0000 mg | ORAL_TABLET | Freq: Two times a day (BID) | ORAL | Status: DC
  Filled 2017-04-15: qty 1

## 2017-04-15 MED ORDER — LORAZEPAM 2 MG/ML IJ SOLN: 0.5000 mg | INTRAMUSCULAR | Status: DC | PRN

## 2017-04-15 MED ORDER — HEPARIN BOLUS VIA INFUSION
900.0000 [IU] | Freq: Once | INTRAVENOUS | Status: AC
  Administered 2017-04-15: 900 [IU] via INTRAVENOUS
  Filled 2017-04-15: qty 900

## 2017-04-15 MED ORDER — MORPHINE SULFATE (PF) 2 MG/ML IV SOLN
1.0000 mg | INTRAVENOUS | Status: DC | PRN
  Administered 2017-04-15 – 2017-04-16 (×5): 1 mg via INTRAVENOUS
  Filled 2017-04-15 (×5): qty 1

## 2017-04-15 MED ORDER — MORPHINE SULFATE (PF) 2 MG/ML IV SOLN: 0.5000 mg | INTRAVENOUS | Status: DC | PRN

## 2017-04-15 NOTE — Care Management Important Message (Signed)
Important Message  Patient Details  Name: Joanna Park MRN: [id number] Date of Birth: [birth year]   Medicare Important Message Given: Signed IM notice given to family  Eber HongGreene, Harrietta Incorvaia R, RN 04/15/2017, 2:13 PM

## 2017-04-15 NOTE — Progress Notes (Signed)
[facility]  Patient: Joanna Park / Admit Date: 04/13/2017 / Date of Encounter: 04/15/2017, 8:24 AM   Subjective: Patient is feeling better today. No evidence of nausea or chest pain this morning. Patient is more active in the bed. Patient's blood pressure slightly more elevated with heart rates stable. No evidence of heart failure Echocardiogram showing severe LV systolic dysfunction with ejection fraction of 25% with akinesis of the apex inferior and posterior wall and hypokinesis of the lateral and anterior wall with moderate mitral regurgitation  Review of Systems:  Cannot assess  Objective: Telemetry: Normal sinus rhythm Physical Exam: Blood pressure 137/73, pulse 74, temperature 98.2 F (36.8 C), temperature source Oral, resp. rate 17, height 5\' 3"  (1.6 m), weight 52.4 kg (115 lb 9.6 oz), SpO2 92 %. Body mass index is 20.48 kg/m. General: Well developed, well nourished, in no acute distress. Head: Normocephalic, atraumatic, sclera non-icteric, no xanthomas, nares are without discharge. Neck: No apparent masses Lungs: Normal respirations with no wheezes, no rhonchi, no rales , no crackles   Heart: Regular rate and rhythm, normal S1 S2, no murmur, no rub, no gallop, PMI is normal size and placement, carotid upstroke normal without bruit, jugular venous pressure normal Abdomen: Soft, non-tender, non-distended with normoactive bowel sounds. No hepatosplenomegaly. Abdominal aorta is normal size without bruit Extremities: No edema, no clubbing, no cyanosis, no ulcers,  Peripheral: 2+ radial, 2+ femoral, 2+ dorsal pedal pulses Neuro: Not Alert and oriented. Moves all extremities spontaneously. Psych:  Does not Responds to questions appropriately with a normal affect.   Intake/Output Summary (Last 24 hours) at 04/15/17 0824 Last data filed at 04/15/17 0500  Gross per 24 hour  Intake            90.13 ml  Output                0 ml  Net             90.13 ml    Inpatient Medications:  . aspirin EC  81 mg Oral Daily  . divalproex  125 mg Oral QHS  . famotidine  20 mg Oral BID  . feeding supplement (ENSURE ENLIVE)  237 mL Oral BID BM  . galantamine  8 mg Oral Daily  . metoprolol tartrate  25 mg Oral BID  . multivitamin  15 mL Oral Daily   Infusions:  . heparin 650 Units/hr (04/15/17 0654)    Labs:  Recent Labs  04/13/17 2342  NA 138  K 4.4  CL 99*  CO2 28  GLUCOSE 137*  BUN 14  CREATININE 0.84  CALCIUM 9.2  MG 2.0    Recent Labs  04/13/17 2342  AST 28  ALT 11*  ALKPHOS 79  BILITOT 0.6  PROT 7.5  ALBUMIN 3.7    Recent Labs  04/14/17 0029 04/15/17 0501  WBC 10.8 12.6*  HGB 12.2 11.8*  HCT 38.1 36.1  MCV 84.3 84.0  PLT 174 172    Recent Labs  04/13/17 2342 04/14/17 0210 04/14/17 0727  TROPONINI 0.04* 0.51* 25.31*   Invalid input(s): POCBNP No results for input(s): HGBA1C in the last 72 hours.   Weights: Filed Weights   04/13/17 2312 04/14/17 0358 04/15/17 0257  Weight: 59 kg (130 lb) 55.1 kg (121 lb 8 oz) 52.4 kg (115 lb 9.6 oz)     Radiology/Studies:  Dg Chest Port 1 View  Result Date: 04/14/2017 CLINICAL DATA:  83 year old female with shortness of breath. EXAM: PORTABLE  CHEST 1 VIEW COMPARISON:  Chest radiograph dated 01/16/2016 FINDINGS: Mild diffuse chronic interstitial coarsening primarily at the lung bases. An area of increased density at the left lung base likely atelectatic changes/scarring. Developing infiltrate is not excluded. Clinical correlation is recommended. There is no pleural effusion or pneumothorax. There is mild cardiomegaly. There is atherosclerotic calcification of the aortic arch. Median sternotomy wires and CABG vascular clips noted. Left pectoral pacemaker device. Osteopenia with degenerative changes of the spine. No acute osseous pathology. IMPRESSION: Left lung base airspace density may represent atelectatic changes or scarring. Developing infiltrate is not  excluded. Clinical correlation is recommended. PA and lateral views of the chest may provide better evaluation. Mild cardiomegaly. Electronically Signed   By: Elgie Collard M.D.   On: 04/14/2017 00:15     Assessment and Recommendation  83 y.o. female with known coronary artery disease status post coronary artery bypass graft previous myocardial infarction with LV systolic dysfunction with a previous heart block that is post pacemaker placed now with significant non-ST elevation myocardial infarction without evidence of heart failure at this time 1. Continue heparin until tomorrow morning for further risk reduction of myocardial infarction and treatment 2. Begin aspirin and Plavix for further risk reduction of cardiovascular event in the future 3. Increase metoprolol for better heart rate blood pressure control status post myocardial infarction 4. Follow closely for any evidence of heart failure 5. Begin ambulation as necessary today into the chair and consider of rehabilitation and possible discharge tomorrow if doing well  Signed, Arnoldo Hooker M.D. FACC

## 2017-04-15 NOTE — Consult Note (Signed)
Consultation Note Date: 04/15/2017   Patient Name: Joanna Park  DOB: 1928/12/28  MRN: [id number]  Age / Sex: 83 y.o., female  PCP: System, Pcp Not In Referring Physician: Enedina Finner, MD  Reason for Consultation: Establishing goals of care  HPI/Patient Profile: Joanna Park  is a 83 y.o. female with a known history of CAD, Dementia, HTN here with chest pain. Patient sent from local nursing home due to complaints of chest pain.  Patient is poor historian with her dementia.  She was found to have pneumonia and an NSTEMI.   Clinical Assessment and Goals of Care: Reviewed medical records, received report from team, assessed the patient and then meet family to discuss diagnosis, prognosis, GOC, EOL wishes disposition and options.  Joanna Park is resting in bed on her side. She is not conversant. Family at bedside. Discussion initiated with family memebers, and during conversation, other family members joined meeting. 3 children present ([id number] [place], [place], and [employer])  and 2 of patient's siblings present at bedside. She is youngest of 9 children. She is retired. Her husband died in 11/15/2013 and she was placed in a facility. She has declined since then. At the facility she has been able to mobilize in a wheel chair rolling herself with her feet. Since she has been here, she has been swatting away food. Per family, she has not attempted to get out of bed. Given her dementia, she had a brief period of lucidity that ended this morning.   A detailed discussion was had today regarding advanced directives.  Concepts specific to code status, artifical feeding and hydration, antibiotics, anticoagulation, and rehospitalization was had. Concepts readdressed as family members entered discussion at various times.  The difference between an aggressive medical intervention path and a palliative or hospice comfort care  path was discussed.  Values and goals of care important to patient and family were also discussed. Natural trajectory and expectations at EOL were discussed.    All members of her family present state they do not want her to suffer, and feel like she is ready to pass. They feel like her lucid period and swatting away food was her way to tell them she is ready. They want to stop her heparin, and any other medications that are not strictly for her comfort at this time, and proceed with Hospice facility placement.      Adult children are decision makers.   MOST form completed for comfort care measures and signed by all 3 children and placed in chart.     SUMMARY OF RECOMMENDATIONS   Medications in place for comfort.    Code Status/Advance Care Planning:  DNR    Symptom Management:   Morphine for pain.   Ativan for anxiety   Additional Recommendations (Limitations, Scope, Preferences):  Full Comfort Care  Prognosis:   < 2 weeks Not eating or drinking, swatting away food. Does not want to get out of bed, failure to thrive. Admitted for PNA and NSTEMI MI, stopped heparin, no anticoagulation.  Treatment completed for PNA.   Discharge Planning: Hospice facility      Primary Diagnoses: Present on Admission: . Chest pain   I have reviewed the medical record, interviewed the patient and family, and examined the patient. The following aspects are pertinent.  Past Medical History:  Diagnosis Date  . Alzheimer's disease   . Appendicitis   . CAD (coronary artery disease)    a. s/p MI and CABG;  b. 04/2012 Cath: LM 70d, LAD 100ost, LCX50-70, OM1 small, subtotal occlusion, RCA 100p, VG->Diag patent, VG->dRCA patent, LIMA->LAD patent, EF40-45% diff HK w/ sev HK of basl inf wall and mod HK of periapical region.  . Cardiac pacemaker in situ   . Heart failure (HCC)   . Ischemic cardiomyopathy    a. 04/2012 Echo: EF 40-45%, mid-dist antersept DK, Gr 1 DD, Triv MR, Mild TR, PASP .    Social History   Social History  . Marital status: Widowed    Spouse name: N/A  . Number of children: N/A  . Years of education: N/A   Social History Main Topics  . Smoking status: Never Smoker  . Smokeless tobacco: Never Used  . Alcohol use No  . Drug use: No  . Sexual activity: Not Asked   Other Topics Concern  . None   Social History Narrative  . None   Family History  Problem Relation Age of Onset  . CAD Brother    Scheduled Meds: . divalproex  125 mg Oral QHS  . famotidine  20 mg Oral BID  . feeding supplement (ENSURE ENLIVE)  237 mL Oral BID BM  . galantamine  8 mg Oral Daily  . metoprolol tartrate  25 mg Oral BID   Continuous Infusions: PRN Meds:.acetaminophen **OR** acetaminophen, albuterol, fluticasone, guaiFENesin-dextromethorphan, LORazepam, morphine injection, ondansetron **OR** ondansetron (ZOFRAN) IV, polyethylene glycol, traMADol Medications Prior to Admission:  Prior to Admission medications   Medication Sig Start Date End Date Taking? Authorizing Provider  acetaminophen (TYLENOL) 325 MG tablet Take 650 mg by mouth every 6 (six) hours as needed.   Yes [provider]  aspirin 81 MG EC tablet Take 4 tablets (325 mg total) by mouth daily. Patient taking differently: Take 81 mg by mouth daily.  04/18/12  Yes Ok Anis, NP  cetirizine (ZYRTEC) 10 MG tablet Take 10 mg by mouth daily as needed for allergies.   Yes [provider]  divalproex (DEPAKOTE) 125 MG DR tablet Take 1 tablet by mouth at bedtime.  12/22/15  Yes [provider]  fluticasone (FLONASE) 50 MCG/ACT nasal spray Place 2 sprays into both nostrils daily as needed (FOR CONGESTION).   Yes [provider]  galantamine (RAZADYNE ER) 8 MG 24 hr capsule Take 1 capsule by mouth daily.  01/01/16  Yes [provider]  guaiFENesin-dextromethorphan (ROBITUSSIN DM) 100-10 MG/5ML syrup Take 10 mLs by mouth every 4 (four) hours as needed for cough.   Yes  [provider]  magnesium hydroxide (MILK OF MAGNESIA) 400 MG/5ML suspension Take 30 mLs by mouth daily as needed for mild constipation.   Yes [provider]  metoprolol tartrate (LOPRESSOR) 25 MG tablet Take 12.5 mg by mouth 2 (two) times daily.    Yes [provider]  Multiple Vitamin (MULTIVITAMIN WITH MINERALS) TABS tablet Take 1 tablet by mouth daily.   Yes [provider]  polyethylene glycol (MIRALAX / GLYCOLAX) packet Take 17 g by mouth every 12 (twelve) hours as needed for mild constipation.   Yes  [provider]  ranitidine (ZANTAC) 75 MG tablet Take 75 mg by mouth daily.    Yes [provider]  nitroGLYCERIN (NITROSTAT) 0.4 MG SL tablet Place 1 tablet (0.4 mg total) under the tongue every 5 (five) minutes x 3 doses as needed for chest pain. Patient not taking: Reported on 04/14/2017 04/18/12   Ok AnisBerge, Christopher R, NP  traMADol (ULTRAM) 50 MG tablet Take 25 mg by mouth every 6 (six) hours as needed.  01/07/16   [provider]   Allergies  Allergen Reactions  . Avelox [Moxifloxacin Hcl In Nacl] Hives  . Biaxin [Clarithromycin] Hives  . Ciprofloxacin Hives  . Codeine     hallucinations  . Darvocet [Propoxyphene N-Acetaminophen] Hives  . Entex Lq [Phenylephrine-Guaifenesin] Hives  . Iodine Hives  . Ivp Dye [Iodinated Diagnostic Agents] Hives  . Meperidine And Related Other (See Comments)    hallucinations  . Morphine And Related     hallucinations  . Oxycodone Hcl Other (See Comments)    hallucinations  . Phenergan [Promethazine Hcl] Other (See Comments)    hallucinations  . Prednisone Other (See Comments)    Hallucinations  . Vancomycin Hives and Itching  . Zocor [Simvastatin]    Review of Systems  Unable to perform ROS   Physical Exam  Constitutional: No distress.  Thin and frail. Resting in bed on her side.     Vital Signs: BP (!) 124/58 (BP Location: Right Arm)   Pulse 65   Temp 98.5 F (36.9 C)  (Oral)   Resp 18   Ht 5\' 3"  (1.6 m)   Wt 52.4 kg (115 lb 9.6 oz)   SpO2 94%   BMI 20.48 kg/m  Pain Assessment: No/denies pain POSS *See Group Information*: 1-Acceptable,Awake and alert Pain Score: 0-No pain   SpO2: SpO2: 94 % O2 Device:SpO2: 94 % O2 Flow Rate: .   IO: Intake/output summary:  Intake/Output Summary (Last 24 hours) at 04/15/17 1402 Last data filed at 04/15/17 1239  Gross per 24 hour  Intake           130.79 ml  Output                0 ml  Net           130.79 ml    LBM: Last BM Date: 04/14/17 Baseline Weight: Weight: 59 kg (130 lb) Most recent weight: Weight: 52.4 kg (115 lb 9.6 oz)     Palliative Assessment/Data: 20%     Time In: 10:30 Time Out: 12:30 Time Total: 2 hours Greater than 50%  of this time was spent counseling and coordinating care related to the above assessment and plan.  Signed by: Morton Stallrystal Ariyon Mittleman, NP 04/15/2017 3:44 PM Office: [phone] 7am-7pm  Pager: [phone] Call primary team after hours   Please contact Palliative Medicine Team phone at [phone] for questions and concerns.  For individual provider: See Loretha StaplerAmion

## 2017-04-15 NOTE — Clinical Social Work Note (Signed)
CSW received consult that patient's family would like the [facility].  CSW made referral to [facility] and [facility] [place] nurse liason.  CSW was informed there are currently not any beds available for today, [facility]  will update CSW as beds become available.  CSW to continue to follow patient's progress throughout discharge planning.  Joanna KnackEric R. Yassen Park, MSW, Theresia MajorsLCSWA [phone]  04/15/2017 2:05 PM

## 2017-04-15 NOTE — Progress Notes (Signed)
New hospice home referral received from [place] following a Palliative Medicine consult. Patient is an 83 year old woman with PMH of Dementia, CAD, and HTN admitted to [facility] from [facility] on 10/29 for evaluation of chest pain. She was found to have pneumonia and a NSTEMI. She has continued with poor appetite, actually refusing food and oral medications.Pallaitive Medicine was consulted for goals of care and met with patients 2 sons and daughter this afternoon. They have chosen to focus on comfort with transfer to the hospice home. Writer met int he room with patient's son Jori Moll and daughter Vaughan Basta to initiate education regarding hospice services, philosophy and team approach to care with good understanding voiced. Questions answer, consents signed. Patient information faxed to referral. Plan is for transfer to the hospice home tomorrow 11/1 pending bed availability. Hospital care team and family aware. Will continue to follow through final disposition. Flo Shanks Rn, BSN, [facility] [place], hospital liaison [phone] c

## 2017-04-15 NOTE — Progress Notes (Signed)
ANTICOAGULATION CONSULT NOTE - Initial Consult  Pharmacy Consult for heparin drip Indication: chest pain/ACS/NSTEMI  Allergies  Allergen Reactions  . Avelox [Moxifloxacin Hcl In Nacl] Hives  . Biaxin [Clarithromycin] Hives  . Ciprofloxacin Hives  . Codeine     hallucinations  . Darvocet [Propoxyphene N-Acetaminophen] Hives  . Entex Lq [Phenylephrine-Guaifenesin] Hives  . Iodine Hives  . Ivp Dye [Iodinated Diagnostic Agents] Hives  . Meperidine And Related Other (See Comments)    hallucinations  . Morphine And Related     hallucinations  . Oxycodone Hcl Other (See Comments)    hallucinations  . Phenergan [Promethazine Hcl] Other (See Comments)    hallucinations  . Prednisone Other (See Comments)    Hallucinations  . Vancomycin Hives and Itching  . Zocor [Simvastatin]     Patient Measurements: Height: 5\' 3"  (160 cm) Weight: 115 lb 9.6 oz (52.4 kg) IBW/kg (Calculated) : 52.4 Heparin Dosing Weight: 59 kg  Vital Signs: Temp: 98.2 F (36.8 C) (10/31 0257) Temp Source: Oral (10/31 0257) BP: 137/73 (10/31 0257) Pulse Rate: 74 (10/31 0257)  Labs:  Recent Labs  04/13/17 2342 04/14/17 0029 04/14/17 0210 04/14/17 0242 04/14/17 0727 04/14/17 1301 04/14/17 2231 04/15/17 0501  HGB  --  12.2  --   --   --   --   --  11.8*  HCT  --  38.1  --   --   --   --   --  36.1  PLT  --  174  --   --   --   --   --  172  APTT  --   --   --  29  --   --   --   --   LABPROT 12.4  --   --   --   --   --   --   --   INR 0.93  --   --   --   --   --   --   --   HEPARINUNFRC  --   --   --   --   --  0.95* 0.45 0.29*  CREATININE 0.84  --   --   --   --   --   --   --   TROPONINI 0.04*  --  0.51*  --  25.31*  --   --   --     Estimated Creatinine Clearance: 39.8 mL/min (by C-G formula based on SCr of 0.84 mg/dL).   Medical History: Past Medical History:  Diagnosis Date  . Alzheimer's disease   . Appendicitis   . CAD (coronary artery disease)    a. s/p MI and CABG;  b. 04/2012  Cath: LM 70d, LAD 100ost, LCX50-70, OM1 small, subtotal occlusion, RCA 100p, VG->Diag patent, VG->dRCA patent, LIMA->LAD patent, EF40-45% diff HK w/ sev HK of basl inf wall and mod HK of periapical region.  . Cardiac pacemaker in situ   . Heart failure (HCC)   . Ischemic cardiomyopathy    a. 04/2012 Echo: EF 40-45%, mid-dist antersept DK, Gr 1 DD, Triv MR, Mild TR, PASP .    Medications:  No anticoagulation in PTA meds  Assessment:  Goal of Therapy:  Heparin level 0.3-0.7 units/ml Monitor platelets by anticoagulation protocol: Yes   Plan:  HL high at 0.95 Decrease rate to 550 units/hr. Recheck level in 6 hours   10/31 AM heparin level 0.29. 900 unit bolus and increase rate to 650 units/hr. Recheck heparin level in 8  hours.  Fulton ReekMatt Demarus Latterell, PharmD, BCPS  04/15/17 6:47 AM

## 2017-04-15 NOTE — Care Management (Signed)
spoke with Crystal with palliative and informed that family wish to pursue hospice plan of care and meets hospice home criteria.  spoke with family regarding hospice agency and informed [facility] preference is [facility] and [facility].  CM received a call from [facility] and informed that agency "would like first dibs at providing hospice care at [facility]".  Patient has been resident at the facility under a long term plan of care.  CM met with family members again and informed of call CM received.  Daughter verbalized that facility has provided excellent care to her mother but would prefer for patient to receive her endo of life care from [facility].

## 2017-04-15 NOTE — Progress Notes (Signed)
Pt moaning, refusing oral food/meds.  Morphine 0.5mg  IV given for comfort.  DNR bracelet applied.  Will monitor.

## 2017-04-15 NOTE — Plan of Care (Signed)
Problem: Cardiac: Goal: Ability to achieve and maintain adequate cardiovascular perfusion will improve Outcome: Not Progressing Pt refusing to eat today, moaning out at times, family at bedside  Problem: Safety: Goal: Ability to remain free from injury will improve Outcome: Progressing Fall precautions in place, non skid socks when oob  Problem: Pain Managment: Goal: General experience of comfort will improve Outcome: Not Progressing Moaning, prn morphine given will continue to monitor pt pain  Problem: Tissue Perfusion: Goal: Risk factors for ineffective tissue perfusion will decrease Outcome: Progressing Remains on Heparin gtt

## 2017-04-16 DIAGNOSIS — F0390 Unspecified dementia without behavioral disturbance: Secondary | ICD-10-CM

## 2017-04-16 DIAGNOSIS — I214 Non-ST elevation (NSTEMI) myocardial infarction: Principal | ICD-10-CM

## 2017-04-16 MED ORDER — MORPHINE SULFATE (CONCENTRATE) 10 MG/0.5ML PO SOLN: 5.0000 mg | ORAL | 0 refills | Status: AC

## 2017-04-16 MED ORDER — MORPHINE SULFATE (PF) 2 MG/ML IV SOLN
1.0000 mg | INTRAVENOUS | Status: DC | PRN
  Administered 2017-04-16 (×2): 2 mg via INTRAVENOUS
  Filled 2017-04-16 (×2): qty 1

## 2017-04-16 MED ORDER — MORPHINE SULFATE (CONCENTRATE) 10 MG/0.5ML PO SOLN
5.0000 mg | ORAL | Status: DC
  Administered 2017-04-16 (×2): 5 mg via ORAL
  Filled 2017-04-16 (×2): qty 1

## 2017-04-16 MED ORDER — SODIUM CHLORIDE 0.9% FLUSH
3.0000 mL | Freq: Two times a day (BID) | INTRAVENOUS | Status: DC
  Administered 2017-04-16: 3 mL via INTRAVENOUS

## 2017-04-16 NOTE — Progress Notes (Signed)
Contacted by staff RN Ma RingsJanci regarding family concern that patient is having unrelieved pain. She is currently receiving scheduled liquid morphine 5 mg q 4 hrs, last dose given at 11:32 am.  Writer contacted Palliative NP Morton Stallrystal Griffin for new orders for PRN IV morphine at family request.  Orders placed, Staff RN Ma RingsJanci aware and will give PRN dose of 2 mg IV morphine. Family also made aware. Report called to the hospice home, EMS notified for 4 PM pick up for transfer to the Hospice home. Family and hospice care team all aware. Thank you. Dayna BarkerKaren Robertson RN, BSN, [facility] [place], hospital Liaison [phone]

## 2017-04-16 NOTE — Progress Notes (Signed)
Follow up visit made to new hospice home referral. Patient seen lying in bed, eyes closed, moans when touched. She is currently receiving scheduled liquid morphine for chest pain/generalized pain. Son  Windy FastRonald at bedside. Family and hospital care team made aware that a bed is available for transfer this afternoon. Writer to arrange 4 pm transport and call report to the hospice home. Hospital care team and family made aware. Will continue to follow through discharge and provide support.Signed DNR and MOST form in place in discharge packet. Thank you. Dayna BarkerKaren Robertson RN, BSN, [facility] [place], hospital liaison [phone]

## 2017-04-16 NOTE — Progress Notes (Signed)
[facility]  Patient: Joanna Park / Admit Date: 04/13/2017 / Date of Encounter: 04/16/2017, 9:05 AM   Subjective: Patient is feeling better today but is still moaning at this time with some diffuse pain of unknown etiology. No evidence of nausea or chest pain this morning.   No evidence of heart failure Echocardiogram showing severe LV systolic dysfunction with ejection fraction of 25% with akinesis of the apex inferior and posterior wall and hypokinesis of the lateral and anterior wall with moderate mitral regurgitation  Review of Systems:  Cannot assess  Objective: Telemetry: Normal sinus rhythm Physical Exam: Blood pressure (!) 124/58, pulse 65, temperature 98.5 F (36.9 C), temperature source Oral, resp. rate 18, height 5\' 3"  (1.6 m), weight 52.4 kg (115 lb 9.6 oz), SpO2 94 %. Body mass index is 20.48 kg/m. General: Well developed, well nourished, in no acute distress. Head: Normocephalic, atraumatic, sclera non-icteric, no xanthomas, nares are without discharge. Neck: No apparent masses Lungs: Normal respirations with no wheezes, no rhonchi, no rales , no crackles   Heart: Regular rate and rhythm, normal S1 S2, no murmur, no rub, no gallop, PMI is normal size and placement, carotid upstroke normal without bruit, jugular venous pressure normal Abdomen: Soft, non-tender, non-distended with normoactive bowel sounds. No hepatosplenomegaly. Abdominal aorta is normal size without bruit Extremities: No edema, no clubbing, no cyanosis, no ulcers,  Peripheral: 2+ radial, 2+ femoral, 2+ dorsal pedal pulses Neuro: Not Alert and oriented. Moves all extremities spontaneously. Psych:  Does not Responds to questions appropriately with a normal affect.   Intake/Output Summary (Last 24 hours) at 04/16/17 0905 Last data filed at 04/15/17 1832  Gross per 24 hour  Intake            36.08 ml  Output                0 ml  Net            36.08 ml     Inpatient Medications:  . divalproex  125 mg Oral QHS  . famotidine  20 mg Oral BID  . feeding supplement (ENSURE ENLIVE)  237 mL Oral BID BM  . galantamine  8 mg Oral Daily  . metoprolol tartrate  25 mg Oral BID  . sodium chloride flush  3 mL Intravenous Q12H   Infusions:    Labs:  Recent Labs  04/13/17 2342  NA 138  K 4.4  CL 99*  CO2 28  GLUCOSE 137*  BUN 14  CREATININE 0.84  CALCIUM 9.2  MG 2.0    Recent Labs  04/13/17 2342  AST 28  ALT 11*  ALKPHOS 79  BILITOT 0.6  PROT 7.5  ALBUMIN 3.7    Recent Labs  04/14/17 0029 04/15/17 0501  WBC 10.8 12.6*  HGB 12.2 11.8*  HCT 38.1 36.1  MCV 84.3 84.0  PLT 174 172    Recent Labs  04/13/17 2342 04/14/17 0210 04/14/17 0727  TROPONINI 0.04* 0.51* 25.31*   Invalid input(s): POCBNP No results for input(s): HGBA1C in the last 72 hours.   Weights: Filed Weights   04/13/17 2312 04/14/17 0358 04/15/17 0257  Weight: 59 kg (130 lb) 55.1 kg (121 lb 8 oz) 52.4 kg (115 lb 9.6 oz)     Radiology/Studies:  Dg Chest Port 1 View  Result Date: 04/14/2017 CLINICAL DATA:  83 year old female with shortness of breath. EXAM: PORTABLE CHEST 1 VIEW COMPARISON:  Chest radiograph dated 01/16/2016 FINDINGS: Mild diffuse chronic interstitial  coarsening primarily at the lung bases. An area of increased density at the left lung base likely atelectatic changes/scarring. Developing infiltrate is not excluded. Clinical correlation is recommended. There is no pleural effusion or pneumothorax. There is mild cardiomegaly. There is atherosclerotic calcification of the aortic arch. Median sternotomy wires and CABG vascular clips noted. Left pectoral pacemaker device. Osteopenia with degenerative changes of the spine. No acute osseous pathology. IMPRESSION: Left lung base airspace density may represent atelectatic changes or scarring. Developing infiltrate is not excluded. Clinical correlation is recommended. PA and lateral views of the  chest may provide better evaluation. Mild cardiomegaly. Electronically Signed   By: Elgie Collard M.D.   On: 04/14/2017 00:15     Assessment and Recommendation  83 y.o. female with known coronary artery disease status post coronary artery bypass graft previous myocardial infarction with LV systolic dysfunction with a previous heart block that is post pacemaker placed now with significant non-ST elevation myocardial infarction without evidence of heart failure at this time 1. Discontinuation of heparin as of yesterday afternoon 2.  Aspirin for further risk reduction of cardiovascular event in the future  3. Hospice involved at this time with continued treatment as per hospice  Signed, Arnoldo Hooker M.D. FACC

## 2017-04-16 NOTE — Progress Notes (Signed)
[facility] [place] [facility]   PATIENT NAME: Joanna Birchwoodatty Blickenstaff    MR#:  [id number]  DATE OF BIRTH:  [birth year]  SUBJECTIVE:  Came in with chest pain and nausea.  [facility]  Family in the room.  REVIEW OF SYSTEMS:   Review of Systems  Constitutional: Negative for chills, fever and weight loss.  HENT: Negative for ear discharge, ear pain and nosebleeds.   Eyes: Negative for blurred vision, pain and discharge.  Respiratory: Negative for sputum production, shortness of breath, wheezing and stridor.   Cardiovascular: Positive for chest pain. Negative for palpitations, orthopnea and PND.  Gastrointestinal: Positive for nausea. Negative for abdominal pain, diarrhea and vomiting.  Genitourinary: Negative for frequency and urgency.  Musculoskeletal: Negative for back pain and joint pain.  Neurological: Negative for sensory change, speech change, focal weakness and weakness.  Psychiatric/Behavioral: Negative for depression and hallucinations. The patient is not nervous/anxious.    Tolerating Diet:some Tolerating PT: does not walk  DRUG ALLERGIES:   Allergies  Allergen Reactions  . Avelox [Moxifloxacin Hcl In Nacl] Hives  . Biaxin [Clarithromycin] Hives  . Ciprofloxacin Hives  . Codeine     hallucinations  . Darvocet [Propoxyphene N-Acetaminophen] Hives  . Entex Lq [Phenylephrine-Guaifenesin] Hives  . Iodine Hives  . Ivp Dye [Iodinated Diagnostic Agents] Hives  . Meperidine And Related Other (See Comments)    hallucinations  . Morphine And Related     hallucinations  . Oxycodone Hcl Other (See Comments)    hallucinations  . Phenergan [Promethazine Hcl] Other (See Comments)    hallucinations  . Prednisone Other (See Comments)    Hallucinations  . Vancomycin Hives and Itching  . Zocor [Simvastatin]     VITALS:  Blood pressure 95/68, pulse (!) 48, temperature 98.3 F (36.8 C), temperature source Axillary,  resp. rate 16, height 5\' 3"  (1.6 m), weight 52.4 kg (115 lb 9.6 oz), SpO2 93 %.  PHYSICAL EXAMINATION:   Physical Exam  GENERAL:  83 y.o.-year-old patient lying in the bed with no acute distress. thin EYES: Pupils equal, round, reactive to light and accommodation. No scleral icterus. Extraocular muscles intact.  HEENT: Head atraumatic, normocephalic. Oropharynx and nasopharynx clear.  NECK:  Supple, no jugular venous distention. No thyroid enlargement, no tenderness.  LUNGS: Normal breath sounds bilaterally, no wheezing, rales, rhonchi. No use of accessory muscles of respiration.  CARDIOVASCULAR: S1, S2 normal. No murmurs, rubs, or gallops.  ABDOMEN: Soft, nontender, nondistended. Bowel sounds present. No organomegaly or mass.  EXTREMITIES: No cyanosis, clubbing or edema b/l.    NEUROLOGIC: Cranial nerves II through XII are intact. No focal Motor or sensory deficits b/l.   PSYCHIATRIC:  patient is alert and oriented x 3.  SKIN: No obvious rash, lesion, or ulcer.   LABORATORY PANEL:  CBC  Recent Labs Lab 04/15/17 0501  WBC 12.6*  HGB 11.8*  HCT 36.1  PLT 172    Chemistries   Recent Labs Lab 04/13/17 2342  NA 138  K 4.4  CL 99*  CO2 28  GLUCOSE 137*  BUN 14  CREATININE 0.84  CALCIUM 9.2  MG 2.0  AST 28  ALT 11*  ALKPHOS 79  BILITOT 0.6   Cardiac Enzymes  Recent Labs Lab 04/14/17 0727  TROPONINI 25.31*   RADIOLOGY:  No results found. ASSESSMENT AND PLAN:   Joanna Park  is a 83 y.o. female with a known history of CAD, Dementia, HTN here with chest pain.  Patient sent from local nursing home due to complaints of chest pain.  Patient is poor historian with her dementia.  Presently is chest pain-free.  Troponin normal.  EKG shows paced rhythm with concern for acute MI.  Cardiology was called and not thought to be ST elevation MI.  *acute  NSTEMI--- medical management was discussed by cardiology with patient's family -Troponin elevated to 0.51--25.6 -Continue  IV heparin drip.  -ASA and Plavix along with beta-blockers  -Unable to give statins due to allergy -Cardiology consult with [facility] appreciate  *Nausea As needed no Zofran  * Chronic systolic chf Stable -Is not in heart failure  *GERD continue PPI  Discussed with patient's son and daughter in the room Overall has not shown much improvement.  She has been declining overall with poor appetite severe dementia at [facility].  Family request palliative care.  Consult placed.  They are aware of poor prognosis.  Case discussed with Care Management/Social Worker. Management plans discussed with the family and they are in agreement.  CODE STATUS: full code  DVT Prophylaxis: Heparin gtt  TOTAL TIME TAKING CARE OF THIS PATIENT: *30* minutes.  >50% time spent on counselling and coordination of care   Note: This dictation was prepared with Dragon dictation along with smaller phrase technology. Any transcriptional errors that result from this process are unintentional.  Twanda Stakes M.D on 04/16/2017 at 10:36 AM  Between 7am to 6pm - Pager - [phone]  After 6pm go to [email or web address] - password [email or web address]  [facility]  Office  [phone]  CC: Primary care physician; System, Pcp Not In

## 2017-04-16 NOTE — Discharge Summary (Signed)
**Note De-Identified Joanna Obfuscation** [place] [facility] [place] NAME: Joanna Park    MR#:  [id number]  DATE OF BIRTH:  Feb 15, 1929  DATE OF ADMISSION:  04/13/2017 ADMITTING PHYSICIAN: Hillary Bow, MD  DATE OF DISCHARGE: 04/16/2017 PRIMARY CARE PHYSICIAN: System, Pcp Not In    ADMISSION DIAGNOSIS:  Shortness of breath [R06.02] Abnormal EKG [R94.31] NSTEMI (non-ST elevated myocardial infarction) (HCC) [I21.4] Elevated troponin I level [R74.8] HCAP (healthcare-associated pneumonia) [J18.9] Chest pain, unspecified type [R07.9] Dementia without behavioral disturbance, unspecified dementia type [F03.90]  DISCHARGE DIAGNOSIS:  Acute NSTEMI--medical management Failure to thrive patient now is comfort measures  SECONDARY DIAGNOSIS:   Past Medical History:  Diagnosis Date  . Alzheimer's disease   . Appendicitis   . CAD (coronary artery disease)    a. s/p MI and CABG;  b. 04/2012 Cath: LM 70d, LAD 100ost, LCX50-70, OM1 small, subtotal occlusion, RCA 100p, VG->Diag patent, VG->dRCA patent, LIMA->LAD patent, EF40-45% diff HK w/ sev HK of basl inf wall and mod HK of periapical region.  . Cardiac pacemaker in situ   . Heart failure ([place])   . Ischemic cardiomyopathy    a. 04/2012 Echo: EF 40-45%, mid-dist antersept DK, Gr 1 DD, Triv MR, Mild TR, PASP 20mHg.    HOSPITAL COURSE:  [name] a 83y.o.femalewith a known history of CAD, Dementia, HTN here with chest pain. Patient sent from local nursing home due to complaints of chest pain. Patient is poor historian with her dementia. Presently is chest pain-free. Troponin normal. EKG shows paced rhythm with concern for acute MI. Cardiology was called and not thought to be ST elevation MI.  *acute  NSTEMI Patient received medical management.  She continued to decline.  *Nausea As needed no Zofran  * Chronic systolic chf Stable -Is not in heart failure  *GERD continue PPI  Patient overall has been  declining due to her other comorbidities.  She declined more so after acute non-Q wave MI.  Family discussion was held.  Given overall poor prognosis and significant dementia family met with palliative care and opted for hospice.  Patient is currently comfort measures and will be discharged to hospice home CONSULTS OBTAINED:  Treatment Team:  KCorey Skains MD  DRUG ALLERGIES:   Allergies  Allergen Reactions  . Avelox [Moxifloxacin Hcl In Nacl] Hives  . Biaxin [Clarithromycin] Hives  . Ciprofloxacin Hives  . Codeine     hallucinations  . Darvocet [Propoxyphene N-Acetaminophen] Hives  . Entex Lq [Phenylephrine-Guaifenesin] Hives  . Iodine Hives  . Ivp Dye [Iodinated Diagnostic Agents] Hives  . Meperidine And Related Other (See Comments)    hallucinations  . Morphine And Related     hallucinations  . Oxycodone Hcl Other (See Comments)    hallucinations  . Phenergan [Promethazine Hcl] Other (See Comments)    hallucinations  . Prednisone Other (See Comments)    Hallucinations  . Vancomycin Hives and Itching  . Zocor [Simvastatin]     DISCHARGE MEDICATIONS:   Current Discharge Medication List    START taking these medications   Details  Morphine Sulfate (MORPHINE CONCENTRATE) 10 MG/0.5ML SOLN concentrated solution Take 0.25 mLs (5 mg total) by mouth every 4 (four) hours. Qty: 180 mL, Refills: 0      STOP taking these medications     acetaminophen (TYLENOL) 325 MG tablet      aspirin 81 MG EC tablet      cetirizine (ZYRTEC) 10 MG tablet      divalproex (DEPAKOTE) 125  MG DR tablet      fluticasone (FLONASE) 50 MCG/ACT nasal spray      galantamine (RAZADYNE ER) 8 MG 24 hr capsule      guaiFENesin-dextromethorphan (ROBITUSSIN DM) 100-10 MG/5ML syrup      magnesium hydroxide (MILK OF MAGNESIA) 400 MG/5ML suspension      metoprolol tartrate (LOPRESSOR) 25 MG tablet      Multiple Vitamin (MULTIVITAMIN WITH MINERALS) TABS tablet      polyethylene glycol (MIRALAX  / GLYCOLAX) packet      ranitidine (ZANTAC) 75 MG tablet      nitroGLYCERIN (NITROSTAT) 0.4 MG SL tablet      traMADol (ULTRAM) 50 MG tablet         If you experience worsening of your admission symptoms, develop shortness of breath, life threatening emergency, suicidal or homicidal thoughts you must seek medical attention immediately by calling 911 or calling your MD immediately  if symptoms less severe.  You Must read complete instructions/literature along with all the possible adverse reactions/side effects for all the Medicines you take and that have been prescribed to you. Take any new Medicines after you have completely understood and accept all the possible adverse reactions/side effects.   Please note  You were cared for by a hospitalist during your hospital stay. If you have any questions about your discharge medications or the care you received while you were in the hospital after you are discharged, you can call the unit and asked to speak with the hospitalist on call if the hospitalist that took care of you is not available. Once you are discharged, your primary care physician will handle any further medical issues. Please note that NO REFILLS for any discharge medications will be authorized once you are discharged, as it is imperative that you return to your primary care physician (or establish a relationship with a primary care physician if you do not have one) for your aftercare needs so that they can reassess your need for medications and monitor your lab values. Today   SUBJECTIVE   Resting comfortably.  Son at the bedside.  VITAL SIGNS:  Blood pressure 95/68, pulse (!) 48, temperature 98.3 F (36.8 C), temperature source Axillary, resp. rate 16, height 5' 3"  (1.6 m), weight 52.4 kg (115 lb 9.6 oz), SpO2 93 %.  I/O:   Intake/Output Summary (Last 24 hours) at 04/16/17 1027 Last data filed at 04/15/17 1832  Gross per 24 hour  Intake            36.08 ml  Output                 0 ml  Net            36.08 ml    PHYSICAL EXAMINATION:  GENERAL:  83 y.o.-year-old patient lying in the bed with no acute distress.  LUNGS: Normal breath sounds bilaterally, no wheezing, rales,rhonchi or crepitation. No use of accessory muscles of respiration.  CARDIOVASCULAR: S1, S2 normal. No murmurs, rubs, or gallops.  ABDOMEN: Soft, non-tender, non-distended. Bowel sounds present. No organomegaly or mass.  EXTREMITIES: No pedal edema, cyanosis, or clubbing.    SKIN: No obvious rash, lesion, or ulcer.   Limited exam secondary to comfort measures.  DATA REVIEW:   CBC   Recent Labs Lab 04/15/17 0501  WBC 12.6*  HGB 11.8*  HCT 36.1  PLT 172    Chemistries   Recent Labs Lab 04/13/17 2342  NA 138  K 4.4  CL 99*  CO2 28  GLUCOSE 137*  BUN 14  CREATININE 0.84  CALCIUM 9.2  MG 2.0  AST 28  ALT 11*  ALKPHOS 79  BILITOT 0.6    Microbiology Results   Recent Results (from the past 240 hour(s))  Blood culture (routine x 2)     Status: None (Preliminary result)   Collection Time: 04/14/17 12:29 AM  Result Value Ref Range Status   Specimen Description BLOOD RIGHT FOREARM  Final   Special Requests Blood Culture adequate volume  Final   Culture NO GROWTH 2 DAYS  Final   Report Status PENDING  Incomplete  Culture, blood (Routine X 2) w Reflex to ID Panel     Status: None (Preliminary result)   Collection Time: 04/14/17  1:00 AM  Result Value Ref Range Status   Specimen Description BLOOD Blood Culture adequate volume  Final   Special Requests   Final    BOTTLES DRAWN AEROBIC AND ANAEROBIC BLOOD RIGHT FOREARM   Culture NO GROWTH 2 DAYS  Final   Report Status PENDING  Incomplete  MRSA PCR Screening     Status: None   Collection Time: 04/14/17  5:10 AM  Result Value Ref Range Status   MRSA by PCR NEGATIVE NEGATIVE Final    Comment:        The GeneXpert MRSA Assay (FDA approved for NASAL specimens only), is one component of a comprehensive MRSA  colonization surveillance program. It is not intended to diagnose MRSA infection nor to guide or monitor treatment for MRSA infections.     RADIOLOGY:  No results found.   Management plans discussed with the patient, family and they are in agreement.  CODE STATUS:     Code Status Orders        Start     Ordered   04/15/17 0905  Do not attempt resuscitation (DNR)  Continuous    Question Answer Comment  In the event of cardiac or respiratory ARREST Do not call a "code blue"   In the event of cardiac or respiratory ARREST Do not perform Intubation, CPR, defibrillation or ACLS   In the event of cardiac or respiratory ARREST Use medication by any route, position, wound care, and other measures to relive pain and suffering. May use oxygen, suction and manual treatment of airway obstruction as needed for comfort.      04/15/17 0905    Code Status History    Date Active Date Inactive Code Status Order ID Comments User Context   04/14/2017  3:48 PM 04/15/2017  9:05 AM Full Code [id number]  Fritzi Mandes, MD Inpatient   04/14/2017  3:38 PM 04/14/2017  3:48 PM DNR [id number]  Fritzi Mandes, MD Inpatient   04/14/2017  2:28 AM 04/14/2017  3:38 PM Full Code [id number]  Hillary Bow, MD ED   04/17/2012  3:48 AM 04/18/2012  3:44 PM Full Code [id number]  Arlina Robes, MD Inpatient    Advance Directive Documentation     Most Recent Value  Type of Advance Directive  Healthcare Power of Attorney  Pre-existing out of facility DNR order (yellow form or pink MOST form)  -  "MOST" Form in Place?  -      TOTAL TIME TAKING CARE OF THIS PATIENT: *40* minutes.    Makalya Nave M.D on 04/16/2017 at 10:27 AM  Between 7am to 6pm - Pager - [phone] After 6pm go to [email or web address] - password [email or web address]  Office  [phone]  CC: Primary care physician; System,  Pcp Not In

## 2017-04-16 NOTE — Plan of Care (Signed)
Problem: Health Behavior/Discharge Planning: Goal: Ability to manage health-related needs will improve Outcome: Adequate for Discharge Patients care will be provided by inpatient hospice.

## 2017-04-16 NOTE — Progress Notes (Signed)
Daily Progress Note   Patient Name: Joanna Park       Date: 04/16/2017 DOB: Jan 04, 1929  Age: 83 y.o. MRN#: [id number] Attending Physician: Enedina Finner, MD Primary Care Physician: System, Pcp Not In Admit Date: 04/13/2017  Reason for Consultation/Follow-up: Terminal Care  Subjective: [name] is resting in bed. Son is at bedside. [name] appears comfortable. She arouses to open eyes with exam, does not say anything. [name] has no questions or concerns. Awaiting bed at hospice facility.   Length of Stay: 2  Current Medications: Scheduled Meds:  . divalproex  125 mg Oral QHS  . famotidine  20 mg Oral BID  . feeding supplement (ENSURE ENLIVE)  237 mL Oral BID BM  . galantamine  8 mg Oral Daily  . metoprolol tartrate  25 mg Oral BID  . sodium chloride flush  3 mL Intravenous Q12H    Continuous Infusions:   PRN Meds: acetaminophen **OR** acetaminophen, albuterol, fluticasone, guaiFENesin-dextromethorphan, LORazepam, morphine injection, ondansetron **OR** ondansetron (ZOFRAN) IV, polyethylene glycol  Physical Exam  Constitutional:  Thin, frail, resting in bed. No signs of discomfort.   Skin: Skin is warm and dry.            Vital Signs: BP 95/68 (BP Location: Right Arm)   Pulse (!) 48   Temp 98.3 F (36.8 C) (Axillary)   Resp 16   Ht 5\' 3"  (1.6 m)   Wt 52.4 kg (115 lb 9.6 oz)   SpO2 93%   BMI 20.48 kg/m  SpO2: SpO2: 93 % O2 Device: O2 Device: Not Delivered O2 Flow Rate:    Intake/output summary:  Intake/Output Summary (Last 24 hours) at 04/16/17 1011 Last data filed at 04/15/17 1832  Gross per 24 hour  Intake            36.08 ml  Output                0 ml  Net            36.08 ml   LBM: Last BM Date: 04/15/17 Baseline Weight: Weight: 59 kg (130  lb) Most recent weight: Weight: 52.4 kg (115 lb 9.6 oz)       Palliative Assessment/Data: 20%      Patient Active Problem List   Diagnosis Date Noted  .  Chest pain 04/14/2017  . Unstable angina (HCC) 04/18/2012  . CAD (coronary artery disease) of artery bypass graft 04/18/2012  . Hyperlipidemia 04/18/2012  . Cardiac pacemaker in situ 04/18/2012  . Hypertension 04/18/2012  . Essential tremor 04/18/2012  . Ischemic cardiomyopathy   . CAD (coronary artery disease)     Palliative Care Assessment & Plan   Patient Profile: [name] a 83 y.o.femalewith a known history of CAD, Dementia, HTN here with chest pain. Patient sent from local nursing home due to complaints of chest pain. Patient is poor historian with her dementia. She was found to have pneumonia and an NSTEMI.    Assessment: Ms. Joanna Park is resting in bed. She has been receiving IV Morphine for pain. She has taken several sips of ensure when offered by family.   Recommendations/Plan:  Hospice facility today.   Goals of Care and Additional Recommendations:  Limitations on Scope of Treatment: Full Comfort Care  Code Status:    Code Status Orders        Start     Ordered   04/15/17 0905  Do not attempt resuscitation (DNR)  Continuous    Question Answer Comment  In the event of cardiac or respiratory ARREST Do not call a "code blue"   In the event of cardiac or respiratory ARREST Do not perform Intubation, CPR, defibrillation or ACLS   In the event of cardiac or respiratory ARREST Use medication by any route, position, wound care, and other measures to relive pain and suffering. May use oxygen, suction and manual treatment of airway obstruction as needed for comfort.      04/15/17 0905    Code Status History    Date Active Date Inactive Code Status Order ID Comments User Context   04/14/2017  3:48 PM 04/15/2017  9:05 AM Full Code [id number]  Enedina FinnerPatel, Sona, MD Inpatient   04/14/2017  3:38 PM 04/14/2017   3:48 PM DNR [id number]  Enedina FinnerPatel, Sona, MD Inpatient   04/14/2017  2:28 AM 04/14/2017  3:38 PM Full Code [id number]  Milagros LollSudini, Srikar, MD ED   04/17/2012  3:48 AM 04/18/2012  3:44 PM Full Code [id number]  Seward SpeckPlitt, David, MD Inpatient    Advance Directive Documentation     Most Recent Value  Type of Advance Directive  Healthcare Power of Attorney  Pre-existing out of facility DNR order (yellow form or pink MOST form)  -  "MOST" Form in Place?  -       Prognosis:   < 2 weeks  Discharge Planning:  Hospice facility  Care plan was discussed with Dr. Allena KatzPatel and Clydie BraunKaren with hospice  Thank you for allowing the Palliative Medicine Team to assist in the care of this patient.   Time In: 10:00 Time Out: 10:25 Total Time 25 minutes Prolonged Time Billed  No      Greater than 50%  of this time was spent counseling and coordinating care related to the above assessment and plan.  Morton Stallrystal Shelina Luo, NP 04/16/2017 10:24 AM Office: [phone] 7am-7pm  Pager: [phone] Call primary team after hours  Please contact Palliative Medicine Team phone at [phone] for questions and concerns.

## 2017-04-16 NOTE — Progress Notes (Signed)
Discharged to hospice House via EMS.  MS 2 mg IVP given prior to transport.  Shortly before discharge patient's daughter noticed the patient did not have her wedding band on.  Not found in room.

## 2017-04-19 LAB — CULTURE, BLOOD (ROUTINE X 2)
Culture: NO GROWTH
Culture: NO GROWTH
SPECIMEN DESCRIPTION: ADEQUATE
Special Requests: ADEQUATE

## 2017-05-16 DEATH — deceased

## 2017-05-20 IMAGING — CT CT ABD-PELV W/O CM
2 of 4 series · 16 of 46 positions shown, 18 images · non-contrast
Comparison: CT abdomen/pelvis 08/29/2012 and CT lumbar spine
09/26/2013

CLINICAL DATA: Patient is allergic to IV contrast. Chest pain.
Acute epigastric pain. Possible bowel obstruction, gastritis or
pancreatitis.

EXAM:
CT ABDOMEN AND PELVIS WITHOUT CONTRAST
TECHNIQUE: Multidetector CT imaging of the abdomen and pelvis was performed
following the standard protocol without IV contrast.

[Series 2: axial st · axial · 0.73mm/px · z∈[-836,-466]mm · 13 of 82 slices shown, 15 images]
[im 4/82  soft-tissue]
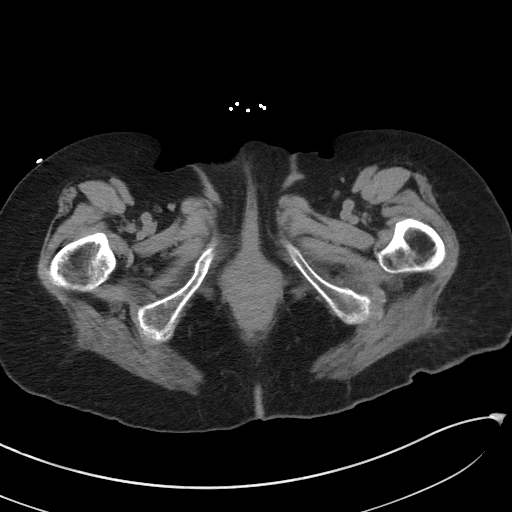
[im 4/82  bone]
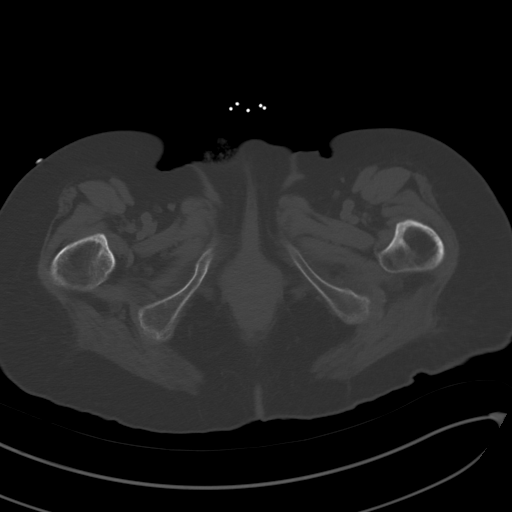
[im 11/82  soft-tissue]
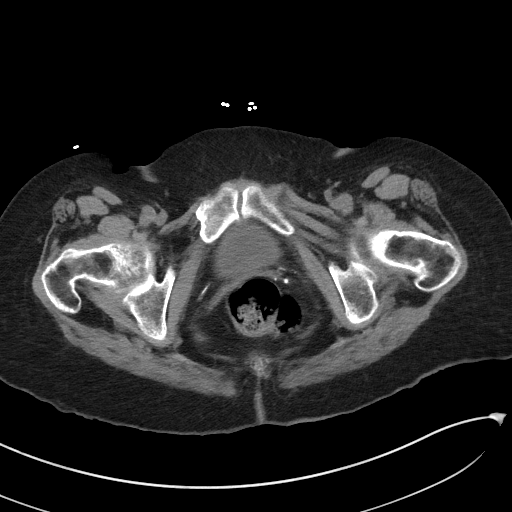
[im 17/82  soft-tissue]
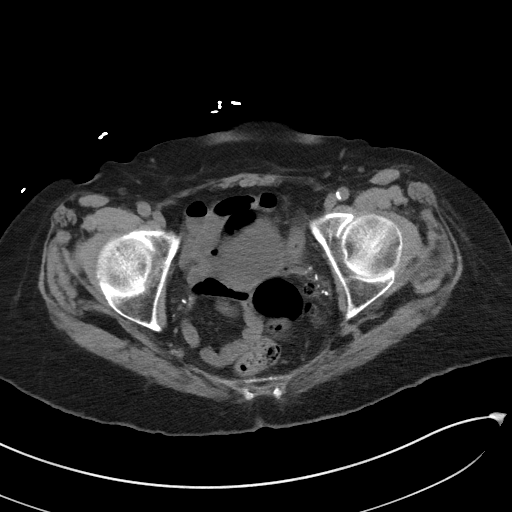
[im 24/82  soft-tissue]
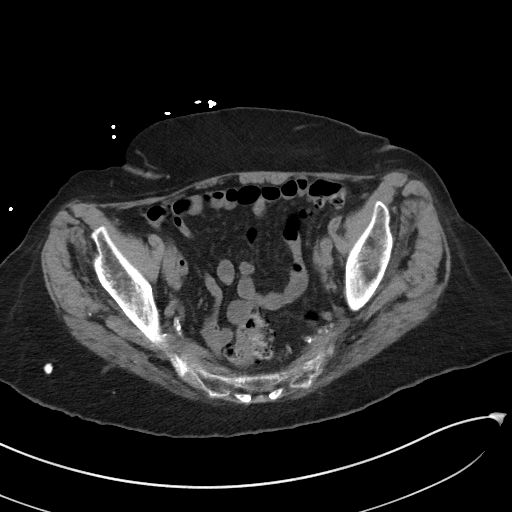
[im 28/82  soft-tissue]
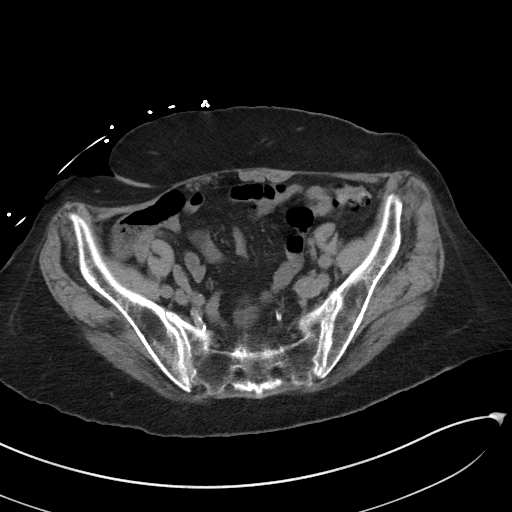
[im 34/82  soft-tissue]
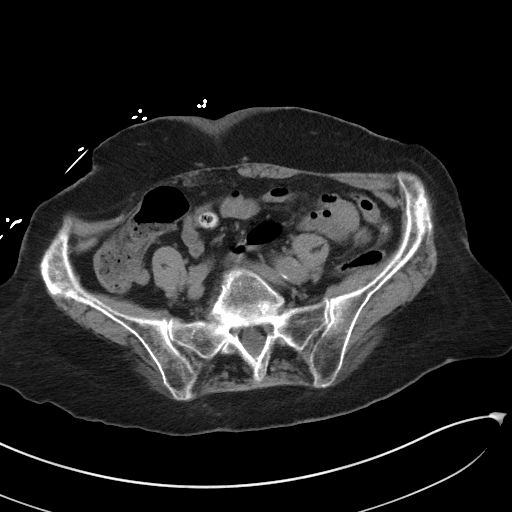
[im 41/82  soft-tissue]
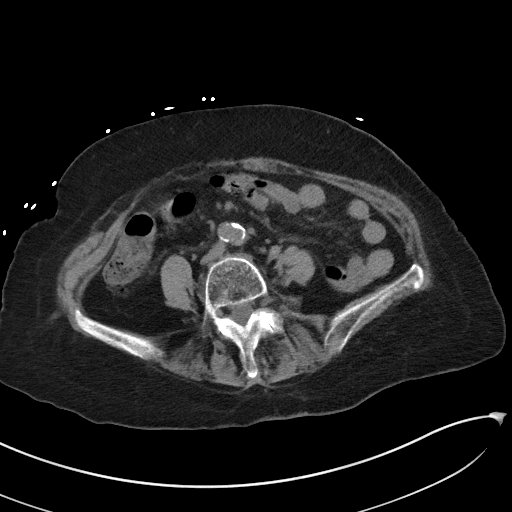
[im 48/82  soft-tissue]
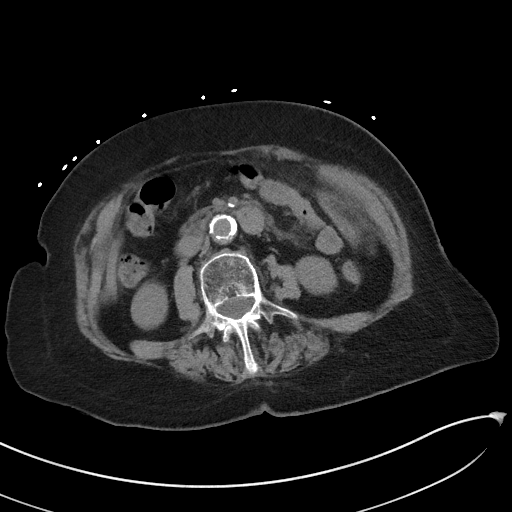
[im 55/82  soft-tissue]
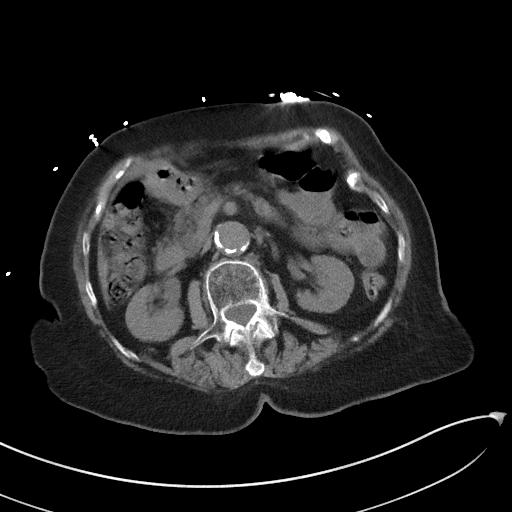
[im 55/82  bone]
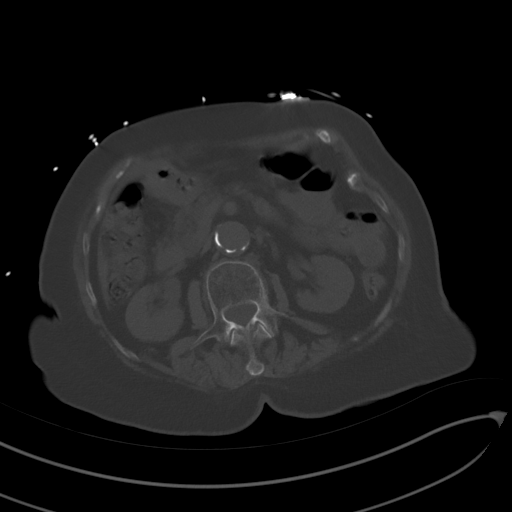
[im 58/82  soft-tissue]
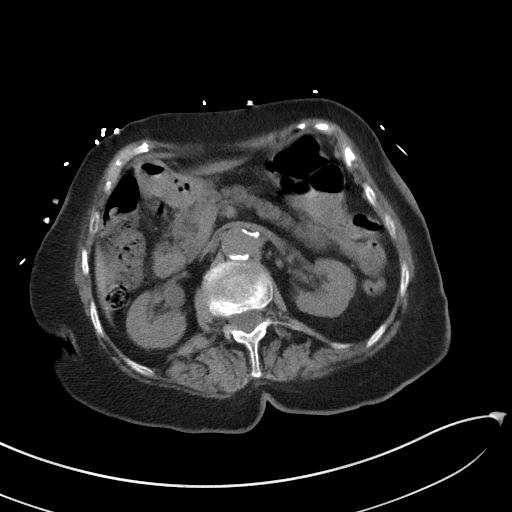
[im 65/82  soft-tissue]
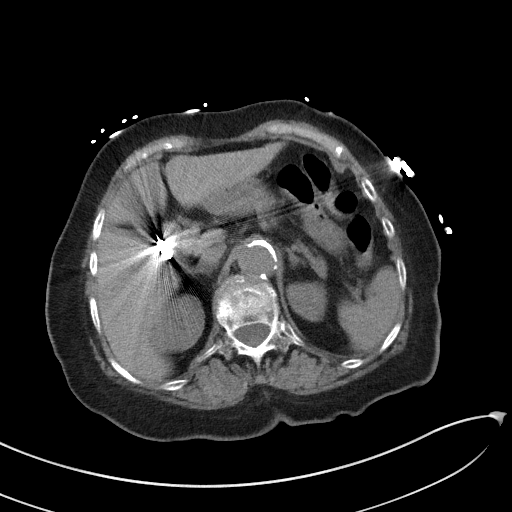
[im 71/82  soft-tissue]
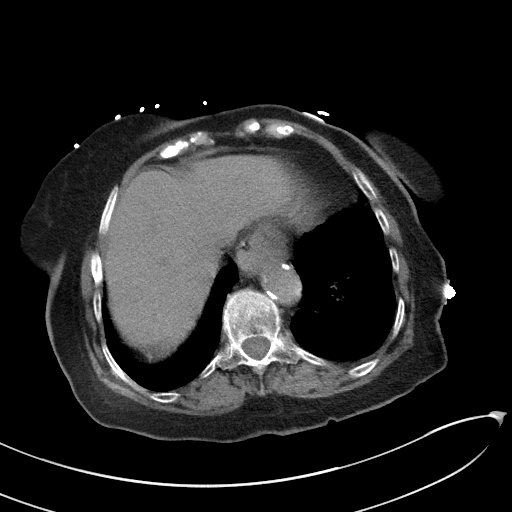
[im 78/82  soft-tissue]
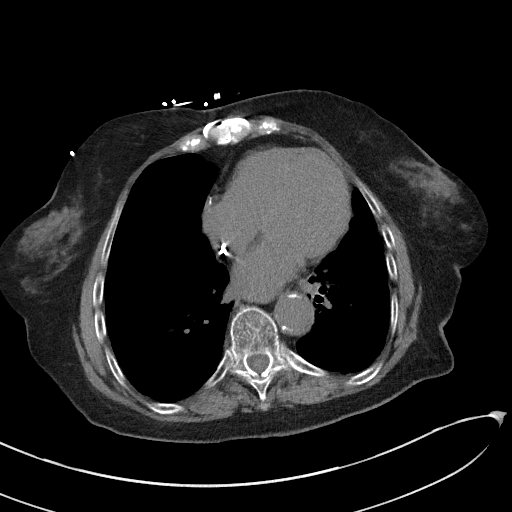

[Series 6: coronal st · coronal · 0.71mm/px · 3 of 82 slices shown]
[im 28/82  soft-tissue]
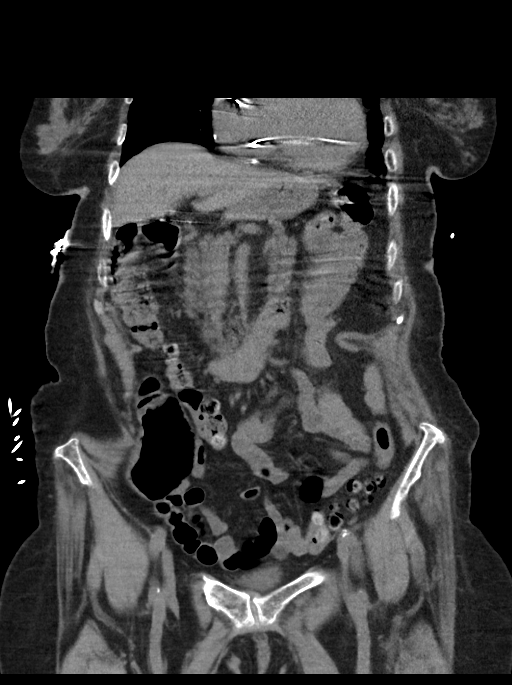
[im 37/82  soft-tissue]
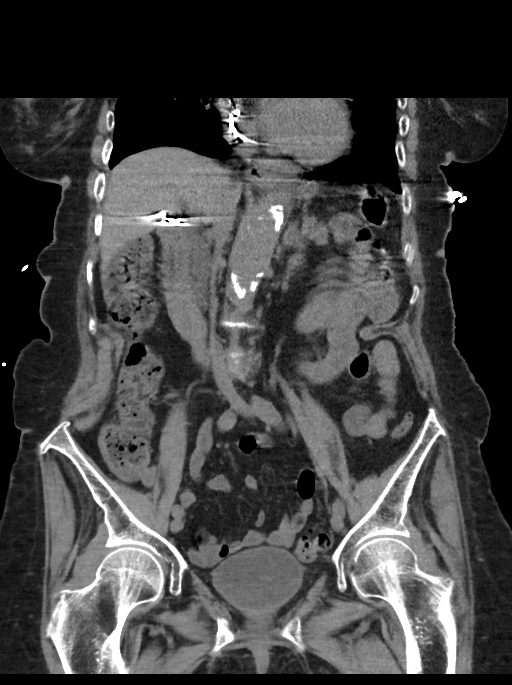
[im 46/82  soft-tissue]
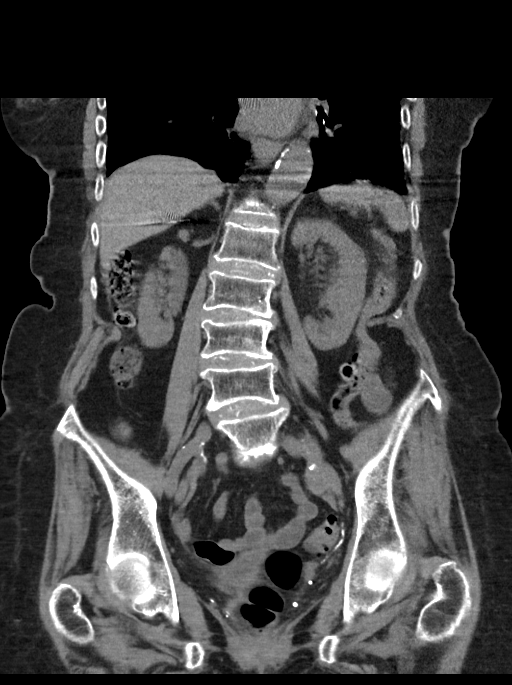

[16 of 46 positions shown; findings below may reference images not displayed]

FINDINGS: Lower chest: Lung bases demonstrate minimal scarring over the
lingula. Sternotomy wires are present. Cardiac pacer leads are
present. Small hiatal hernia. Calcified plaque over the descending
thoracic aorta.

Hepatobiliary: Previous cholecystectomy. Stable post cholecystectomy
prominence of the common bile duct. Liver is within normal without
focal mass.

Pancreas: No mass or inflammatory process identified on this
un-enhanced exam.

Spleen: Within normal limits in size.

Adrenals/Urinary Tract: Adrenal glands are normal and symmetric.
Kidneys are normal in size without hydronephrosis or
nephrolithiasis. Ureters are within normal. Bladder is normal.

Stomach/Bowel: Small hiatal hernia, otherwise stomach is within
normal. Small bowel is within normal. There is mild diverticulosis
throughout the colon. Previous appendectomy.

Vascular/Lymphatic: No pathologically enlarged lymph nodes. No
evidence of abdominal aortic aneurysm. Moderate calcified plaque
over the abdominal aorta.

Reproductive: Previous hysterectomy.  Adnexa within normal.

Other: No free fluid or focal inflammatory change.

Musculoskeletal: Degenerate changes of the spine and hips. No
compression fracture or subluxation.
IMPRESSION: No acute findings in the abdomen/pelvis.

Diverticulosis of the colon.

Small hiatal hernia.

Aortic atherosclerosis.
